# Patient Record
Sex: Female | Born: 1970 | Race: Black or African American | Hispanic: No | Marital: Single | Smoking: Never smoker
Health system: Southern US, Community
[De-identification: ages and names within clinical notes are randomized; demographics above are authoritative.]

## PROBLEM LIST (undated history)

## (undated) ENCOUNTER — Ambulatory Visit (HOSPITAL_COMMUNITY): Discharge status: Absent without leave | Payer: Self-pay

## (undated) DIAGNOSIS — E119 Type 2 diabetes mellitus without complications: Secondary | ICD-10-CM

## (undated) DIAGNOSIS — I10 Essential (primary) hypertension: Secondary | ICD-10-CM

## (undated) HISTORY — DX: Essential (primary) hypertension: I10

## (undated) HISTORY — PX: APPENDECTOMY: SHX54

## (undated) HISTORY — DX: Type 2 diabetes mellitus without complications: E11.9

---

## 2016-02-25 ENCOUNTER — Emergency Department (HOSPITAL_COMMUNITY)
Admission: EM | Admit: 2016-02-25 | Discharge: 2016-02-25 | Disposition: A | Discharge status: Home / Self Care | Payer: Self-pay | Attending: Emergency Medicine | Admitting: Emergency Medicine

## 2016-02-25 ENCOUNTER — Encounter (HOSPITAL_COMMUNITY): Payer: Self-pay | Admitting: Emergency Medicine

## 2016-02-25 DIAGNOSIS — R1033 Periumbilical pain: Secondary | ICD-10-CM | POA: Insufficient documentation

## 2016-02-25 LAB — GC/CHLAMYDIA PROBE AMP (~~LOC~~) NOT AT ARMC
Chlamydia: NEGATIVE
Neisseria Gonorrhea: NEGATIVE

## 2016-02-25 LAB — WET PREP, GENITAL
CLUE CELLS WET PREP: NONE SEEN
SPERM: NONE SEEN
Trich, Wet Prep: NONE SEEN
YEAST WET PREP: NONE SEEN

## 2016-02-25 LAB — URINALYSIS, ROUTINE W REFLEX MICROSCOPIC
Bilirubin Urine: NEGATIVE
GLUCOSE, UA: NEGATIVE mg/dL
HGB URINE DIPSTICK: NEGATIVE
KETONES UR: NEGATIVE mg/dL
Leukocytes, UA: NEGATIVE
Nitrite: NEGATIVE
PH: 5 (ref 5.0–8.0)
PROTEIN: NEGATIVE mg/dL
Specific Gravity, Urine: 1.018 (ref 1.005–1.030)

## 2016-02-25 LAB — PREGNANCY, URINE: Preg Test, Ur: NEGATIVE

## 2016-02-25 MED ORDER — HYDROCODONE-ACETAMINOPHEN 5-325 MG PO TABS
1.0000 | ORAL_TABLET | Freq: Once | ORAL | Status: AC
  Administered 2016-02-25: 1 via ORAL
  Filled 2016-02-25: qty 1

## 2016-02-25 MED ORDER — HYDROCODONE-ACETAMINOPHEN 5-325 MG PO TABS: 1.0000 | ORAL_TABLET | ORAL | 0 refills | Status: DC | PRN

## 2016-02-25 NOTE — ED Triage Notes (Signed)
Per pt, she has lower abdominal pain x 1 day. Pt describes pain as pressure. Denies n/v/diarrhea. Pt states she is having frequent urination, denies vaginal discharge, or painful urination.

## 2016-02-25 NOTE — ED Provider Notes (Signed)
[facility]PT Provider Note   CSN: [id number] Arrival date & time: 02/25/16  0408     History   Chief Complaint Chief Complaint  Patient presents with  . Abdominal Pain    HPI Patricia Johnson is a 46 y.o. female.  Patient presents with complaint of periumbilical abdominal pain for 2-3 days. No aggravating or alleviating factors. No dysuria, vaginal discharge, vaginal bleeding. She reports she had a miscarriage 3 months ago requiring DnC. She has not had pain since that time but reports a missed period 3 weeks ago. No change in bowel movements. No nausea or vomiting. No fever. The pain does not radiate.    The history is provided by the patient. No language interpreter was used.    History reviewed. No pertinent past medical history.  There are no active problems to display for this patient.   History reviewed. No pertinent surgical history.  OB History    No data available       Home Medications    Prior to Admission medications   Not on File    Family History No family history on file.  Social History Social History  Substance Use Topics  . Smoking status: Never Smoker  . Smokeless tobacco: Never Used  . Alcohol use No     Allergies   Patient has no known allergies.   Review of Systems Review of Systems  Constitutional: Negative for chills and fever.  HENT: Negative.   Respiratory: Negative.   Cardiovascular: Negative.   Gastrointestinal: Positive for abdominal pain. Negative for constipation, diarrhea, nausea and vomiting.  Genitourinary: Negative for dysuria, vaginal bleeding and vaginal discharge.  Musculoskeletal: Negative.   Skin: Negative.   Neurological: Negative.      Physical Exam Updated Vital Signs BP 137/92 (BP Location: Right Arm)   Pulse 64   Temp 97.9 F (36.6 C) (Oral)   Resp 18   LMP 01/28/2016   SpO2 100%   Physical Exam  Constitutional: She appears well-developed and well-nourished.  HENT:    Head: Normocephalic.  Neck: Normal range of motion. Neck supple.  Cardiovascular: Normal rate and regular rhythm.   Pulmonary/Chest: Effort normal and breath sounds normal.  Abdominal: Soft. Bowel sounds are normal. There is tenderness (periumbilical abdominal tenderness. ). There is no rebound and no guarding.  Musculoskeletal: Normal range of motion.  Neurological: She is alert. No cranial nerve deficit.  Skin: Skin is warm and dry. No rash noted.  Psychiatric: She has a normal mood and affect.     ED Treatments / Results  Labs (all labs ordered are listed, but only abnormal results are displayed) Labs Reviewed  URINALYSIS, ROUTINE W REFLEX MICROSCOPIC - Abnormal; Notable for the following:       Result Value   APPearance HAZY (*)    All other components within normal limits  WET PREP, GENITAL  PREGNANCY, URINE  GC/CHLAMYDIA PROBE AMP ([place]) NOT [facility]    EKG  EKG Interpretation None       Radiology No results found.  Procedures Procedures (including critical care time)  Medications Ordered in ED Medications  HYDROcodone-acetaminophen (NORCO/VICODIN) 5-325 MG per tablet 1 tablet (1 tablet Oral Given 02/25/16 0517)     Initial Impression / Assessment and Plan / ED Course  I have reviewed the triage vital signs and the nursing notes.  Pertinent labs & imaging results that were available during my care of the patient were reviewed by me and considered in my medical decision making (  see chart for details).  Clinical Course     Patient reports periumbilical abdominal discomfort for 3 days. No fever, vomiting, aggravating or alleviating factors. She has tenderness on exam without guarding, a normal vaginal exam and a negative urine.   She reports her pain is resolved on re-examination. VSS. Discussed return precautions and outpatient follow up for recheck of symptoms.   Final Clinical Impressions(s) / ED Diagnoses   Final diagnoses:  None   1.  Abdominal pain  New Prescriptions New Prescriptions   No medications on file     Elpidio Anis, Cordelia Poche 02/27/16 0544    Zadie Rhine, MD 02/28/16 1431

## 2016-06-21 ENCOUNTER — Encounter (HOSPITAL_COMMUNITY): Payer: Self-pay

## 2016-06-21 DIAGNOSIS — R739 Hyperglycemia, unspecified: Secondary | ICD-10-CM | POA: Insufficient documentation

## 2016-06-21 DIAGNOSIS — K5900 Constipation, unspecified: Secondary | ICD-10-CM | POA: Insufficient documentation

## 2016-06-21 DIAGNOSIS — K8020 Calculus of gallbladder without cholecystitis without obstruction: Secondary | ICD-10-CM | POA: Insufficient documentation

## 2016-06-21 LAB — COMPREHENSIVE METABOLIC PANEL
ALBUMIN: 4.2 g/dL (ref 3.5–5.0)
ALT: 18 U/L (ref 14–54)
ANION GAP: 8 (ref 5–15)
AST: 31 U/L (ref 15–41)
Alkaline Phosphatase: 56 U/L (ref 38–126)
BILIRUBIN TOTAL: 1 mg/dL (ref 0.3–1.2)
BUN: 12 mg/dL (ref 6–20)
CHLORIDE: 105 mmol/L (ref 101–111)
CO2: 24 mmol/L (ref 22–32)
Calcium: 9.2 mg/dL (ref 8.9–10.3)
Creatinine, Ser: 0.79 mg/dL (ref 0.44–1.00)
GFR calc Af Amer: >60 mL/min (ref >60)
GFR calc non Af Amer: >60 mL/min (ref >60)
GLUCOSE: 138 mg/dL — AB (ref 65–99)
POTASSIUM: 4.5 mmol/L (ref 3.5–5.1)
Sodium: 137 mmol/L (ref 135–145)
TOTAL PROTEIN: 7.5 g/dL (ref 6.5–8.1)

## 2016-06-21 LAB — URINALYSIS, ROUTINE W REFLEX MICROSCOPIC
Bilirubin Urine: NEGATIVE
GLUCOSE, UA: NEGATIVE mg/dL
HGB URINE DIPSTICK: NEGATIVE
Ketones, ur: NEGATIVE mg/dL
LEUKOCYTES UA: NEGATIVE
NITRITE: NEGATIVE
PH: 5 (ref 5.0–8.0)
Protein, ur: 100 mg/dL — AB
SPECIFIC GRAVITY, URINE: 1.028 (ref 1.005–1.030)

## 2016-06-21 LAB — CBC
HEMATOCRIT: 40.7 % (ref 36.0–46.0)
HEMOGLOBIN: 13.4 g/dL (ref 12.0–15.0)
MCH: 28.3 pg (ref 26.0–34.0)
MCHC: 32.9 g/dL (ref 30.0–36.0)
MCV: 86 fL (ref 78.0–100.0)
PLATELETS: 243 10*3/uL (ref 150–400)
RBC: 4.73 MIL/uL (ref 3.87–5.11)
RDW: 13.7 % (ref 11.5–15.5)
WBC: 7.6 10*3/uL (ref 4.0–10.5)

## 2016-06-21 LAB — POC URINE PREG, ED: Preg Test, Ur: NEGATIVE

## 2016-06-21 LAB — LIPASE, BLOOD: LIPASE: 19 U/L (ref 11–51)

## 2016-06-21 NOTE — ED Triage Notes (Signed)
Pt complaining of R sided abdominal pain. Pt denies any urinary symptoms. Pt denies any N/V/D at triage. Pt states LMP 2 months ago. Pt denies any fevers.

## 2016-06-22 ENCOUNTER — Emergency Department (HOSPITAL_COMMUNITY)
Admission: EM | Admit: 2016-06-22 | Discharge: 2016-06-22 | Disposition: A | Discharge status: Home / Self Care | Payer: Self-pay | Attending: Emergency Medicine | Admitting: Emergency Medicine

## 2016-06-22 ENCOUNTER — Emergency Department (HOSPITAL_COMMUNITY): Payer: Self-pay

## 2016-06-22 DIAGNOSIS — R739 Hyperglycemia, unspecified: Secondary | ICD-10-CM

## 2016-06-22 DIAGNOSIS — K8020 Calculus of gallbladder without cholecystitis without obstruction: Secondary | ICD-10-CM

## 2016-06-22 DIAGNOSIS — K5900 Constipation, unspecified: Secondary | ICD-10-CM

## 2016-06-22 DIAGNOSIS — R109 Unspecified abdominal pain: Secondary | ICD-10-CM

## 2016-06-22 MED ORDER — IOPAMIDOL (ISOVUE-300) INJECTION 61%
INTRAVENOUS | Status: AC
  Administered 2016-06-22: 100 mL
  Filled 2016-06-22: qty 100

## 2016-06-22 MED ORDER — DIPHENHYDRAMINE HCL 50 MG/ML IJ SOLN
25.0000 mg | Freq: Once | INTRAMUSCULAR | Status: AC
  Administered 2016-06-22: 25 mg via INTRAVENOUS
  Filled 2016-06-22: qty 1

## 2016-06-22 MED ORDER — MAGNESIUM CITRATE PO SOLN
1.0000 | Freq: Once | ORAL | Status: AC
  Administered 2016-06-22: 1 via ORAL
  Filled 2016-06-22: qty 296

## 2016-06-22 MED ORDER — KETOROLAC TROMETHAMINE 30 MG/ML IJ SOLN
30.0000 mg | Freq: Once | INTRAMUSCULAR | Status: AC
  Administered 2016-06-22: 30 mg via INTRAVENOUS
  Filled 2016-06-22: qty 1

## 2016-06-22 NOTE — Discharge Instructions (Signed)
Your blood sugar was slightly elevated today - 138. You need to have this rechecked several times over the next 2-3 weeks. If it stays elevated, you may need to start treatment for diabetes.

## 2016-06-22 NOTE — ED Provider Notes (Signed)
[facility] Provider Note   CSN: [id number] Arrival date & time: 06/21/16  2057  By signing my name below, I, Cynda Acres, attest that this documentation has been prepared under the direction and in the presence of Dione Booze, MD. Electronically Signed: Cynda Acres, Scribe. 06/22/16. 1:14 AM.  History   Chief Complaint Chief Complaint  Patient presents with  . Abdominal Pain    HPI Comments: Patricia Johnson is a 46 y.o. female with no pertinent past medical history, who presents to the Emergency Department complaining of sudden-onset, constant right-sided abdominal pain that began one week ago. Patient reports excruciating abdominal pain that began one week ago. Patient was seen here in January for similar symptoms, resolved with hydrocodone. No associated symptoms. Patient reports taking tylenol with minimal relief. LNMP was two months ago. Patient denies any nausea, vomiting, trouble urinating, dysuria, fever, or chills.   The history is provided by the patient. No language interpreter was used.    History reviewed. No pertinent past medical history.  There are no active problems to display for this patient.   History reviewed. No pertinent surgical history.  OB History    No data available       Home Medications    Prior to Admission medications   Medication Sig Start Date End Date Taking? Authorizing Provider  HYDROcodone-acetaminophen (NORCO/VICODIN) 5-325 MG tablet Take 1 tablet by mouth every 4 (four) hours as needed. 02/25/16   Elpidio Anis, PA-C    Family History History reviewed. No pertinent family history.  Social History Social History  Substance Use Topics  . Smoking status: Never Smoker  . Smokeless tobacco: Never Used  . Alcohol use No     Allergies   Patient has no known allergies.   Review of Systems Review of Systems  Constitutional: Negative for chills and fever.  Gastrointestinal: Positive for abdominal pain.  Negative for nausea and vomiting.  All other systems reviewed and are negative.    Physical Exam Updated Vital Signs BP 124/90   Pulse 82   Temp 98.2 F (36.8 C) (Oral)   Resp 16   Ht 5\' 5"  (1.651 m)   Wt 233 lb 4.8 oz (105.8 kg)   LMP 04/09/2016 (Approximate)   SpO2 100%   BMI 38.82 kg/m   Physical Exam  Constitutional: She is oriented to person, place, and time. She appears well-developed and well-nourished.  HENT:  Head: Normocephalic and atraumatic.  Eyes: EOM are normal. Pupils are equal, round, and reactive to light.  Neck: Normal range of motion. Neck supple. No JVD present.  Cardiovascular: Normal rate, regular rhythm and normal heart sounds.   No murmur heard. Pulmonary/Chest: Effort normal and breath sounds normal. She has no wheezes. She has no rales. She exhibits no tenderness.  Abdominal: Soft. Bowel sounds are normal. She exhibits no distension and no mass. There is tenderness. There is no rebound and no guarding.  Moderate right lower quadrant tenderness.   Musculoskeletal: Normal range of motion. She exhibits no edema.  Lymphadenopathy:    She has no cervical adenopathy.  Neurological: She is alert and oriented to person, place, and time. No cranial nerve deficit. She exhibits normal muscle tone. Coordination normal.  Skin: Skin is warm and dry. No rash noted.  Psychiatric: She has a normal mood and affect. Her behavior is normal. Judgment and thought content normal.  Nursing note and vitals reviewed.    ED Treatments / Results  DIAGNOSTIC STUDIES: Oxygen Saturation is 100% on RA, normal  by my interpretation.    COORDINATION OF CARE: 1:13 AM Discussed treatment plan with pt at bedside and pt agreed to plan, which includes pain medication and an abdominal CT scan.   Labs (all labs ordered are listed, but only abnormal results are displayed) Labs Reviewed  COMPREHENSIVE METABOLIC PANEL - Abnormal; Notable for the following:       Result Value    Glucose, Bld 138 (*)    All other components within normal limits  URINALYSIS, ROUTINE W REFLEX MICROSCOPIC - Abnormal; Notable for the following:    APPearance CLOUDY (*)    Protein, ur 100 (*)    Bacteria, UA RARE (*)    Squamous Epithelial / LPF 6-30 (*)    All other components within normal limits  LIPASE, BLOOD  CBC  POC URINE PREG, ED    Radiology Ct Abdomen Pelvis W Contrast  Result Date: 06/22/2016 CLINICAL DATA:  Right-sided abdominal pain EXAM: CT ABDOMEN AND PELVIS WITH CONTRAST TECHNIQUE: Multidetector CT imaging of the abdomen and pelvis was performed using the standard protocol following bolus administration of intravenous contrast. CONTRAST:  100mL ISOVUE-300 IOPAMIDOL (ISOVUE-300) INJECTION 61% COMPARISON:  None. FINDINGS: Lower chest: No acute abnormality. Hepatobiliary: Mild hepatic steatosis with uncomplicated cholelithiasis. No biliary dilatation or focal hepatic mass. Pancreas: Normal Spleen: Normal Adrenals/Urinary Tract: Adrenal glands are unremarkable. Kidneys are normal, without renal calculi, focal lesion, or hydronephrosis. Bladder is unremarkable. Stomach/Bowel: Normal appendix. Moderate colonic stool burden without bowel obstruction or inflammation. Vascular/Lymphatic: No significant vascular findings are present. No enlarged abdominal or pelvic lymph nodes. Reproductive: Fibroid uterus with left-sided 3.3 cm intramural fundal fibroid with central hypodensity that may represent a degenerating fibroid. Small amount of fluid in the endocervical canal. No adnexal mass. Probable dominant follicle the left ovary. Other: Small fat containing umbilical hernia. No abdominopelvic ascites. Musculoskeletal: No acute or significant osseous findings. Subcutaneous gluteal soft tissue induration likely related to gluteal injections. IMPRESSION: 1. Hepatic steatosis. 2. Uncomplicated cholelithiasis. 3. Degenerating left-sided intramural fundal fibroid measuring 3.3 cm. 4. Increased  colonic stool burden.  Normal appendix. Electronically Signed   By: Tollie Ethavid  Kwon M.D.   On: 06/22/2016 03:50    Procedures Procedures (including critical care time)  Medications Ordered in ED Medications  magnesium citrate solution 1 Bottle (not administered)  ketorolac (TORADOL) 30 MG/ML injection 30 mg (30 mg Intravenous Given 06/22/16 0254)  diphenhydrAMINE (BENADRYL) injection 25 mg (25 mg Intravenous Given 06/22/16 0305)  iopamidol (ISOVUE-300) 61 % injection (100 mLs  Contrast Given 06/22/16 0327)     Initial Impression / Assessment and Plan / ED Course  I have reviewed the triage vital signs and the nursing notes.  Pertinent labs & imaging results that were available during my care of the patient were reviewed by me and considered in my medical decision making (see chart for details).  Right lower quadrant abdominal pain. Tenderness is maximum over McBurney's area. Initial laboratory workup is unremarkable. We'll send for CT of abdomen and pelvis. Old records are reviewed, and she has a prior ED visit in January of this year for abdominal pain.  CT scan shows increased stool burden and cholelithiasis but no acute process. After workup is significant only for borderline hyperglycemia of. This will need to be followed as an outpatient. She is given a dose of magnesium citrate. Doubt cholelithiasis is the cause of her pain. She is given referral to gastroenterology for further outpatient evaluation is indicated.  Final Clinical Impressions(s) / ED Diagnoses   Final  diagnoses:  Abdominal pain, unspecified abdominal location  Constipation, unspecified constipation type  Hyperglycemia  Calculus of gallbladder without cholecystitis without obstruction    New Prescriptions New Prescriptions   No medications on file   I personally performed the services described in this documentation, which was scribed in my presence. The recorded information has been reviewed and is accurate.        Dione Booze, MD 06/22/16 [phone]

## 2019-10-18 ENCOUNTER — Encounter: Payer: Self-pay | Admitting: Family Medicine

## 2019-10-18 ENCOUNTER — Ambulatory Visit (INDEPENDENT_AMBULATORY_CARE_PROVIDER_SITE_OTHER): Payer: 59 | Admitting: Family Medicine

## 2019-10-18 ENCOUNTER — Other Ambulatory Visit (HOSPITAL_COMMUNITY)
Admission: RE | Admit: 2019-10-18 | Discharge: 2019-10-18 | Disposition: A | Discharge status: Home / Self Care | Payer: Self-pay | Source: Ambulatory Visit | Attending: Family Medicine | Admitting: Family Medicine

## 2019-10-18 ENCOUNTER — Other Ambulatory Visit: Payer: Self-pay

## 2019-10-18 VITALS — BP 130/80 | HR 90 | Ht 66.0 in | Wt 225.8 lb

## 2019-10-18 DIAGNOSIS — K760 Fatty (change of) liver, not elsewhere classified: Secondary | ICD-10-CM

## 2019-10-18 DIAGNOSIS — Z7185 Encounter for immunization safety counseling: Secondary | ICD-10-CM

## 2019-10-18 DIAGNOSIS — Z1211 Encounter for screening for malignant neoplasm of colon: Secondary | ICD-10-CM

## 2019-10-18 DIAGNOSIS — A159 Respiratory tuberculosis unspecified: Secondary | ICD-10-CM | POA: Insufficient documentation

## 2019-10-18 DIAGNOSIS — Z0000 Encounter for general adult medical examination without abnormal findings: Secondary | ICD-10-CM | POA: Diagnosis not present

## 2019-10-18 DIAGNOSIS — Z23 Encounter for immunization: Secondary | ICD-10-CM | POA: Diagnosis not present

## 2019-10-18 DIAGNOSIS — Z1231 Encounter for screening mammogram for malignant neoplasm of breast: Secondary | ICD-10-CM | POA: Diagnosis not present

## 2019-10-18 DIAGNOSIS — Z1329 Encounter for screening for other suspected endocrine disorder: Secondary | ICD-10-CM

## 2019-10-18 DIAGNOSIS — E669 Obesity, unspecified: Secondary | ICD-10-CM

## 2019-10-18 DIAGNOSIS — Z7189 Other specified counseling: Secondary | ICD-10-CM

## 2019-10-18 DIAGNOSIS — Z1159 Encounter for screening for other viral diseases: Secondary | ICD-10-CM

## 2019-10-18 DIAGNOSIS — Z124 Encounter for screening for malignant neoplasm of cervix: Secondary | ICD-10-CM

## 2019-10-18 NOTE — Patient Instructions (Signed)

## 2019-10-18 NOTE — Progress Notes (Signed)
Subjective:    Patient ID: Patricia Johnson, female    DOB: 1970-08-25, 49 y.o.   MRN: [id number]  HPI Chief Complaint  Patient presents with  . new pt    fasting cpe, never had pap smear   She is new to the practice and here for a complete physical exam. Moved here from [place] in 2017.  Last CPE: 2017   Other providers: None   Reviewed chart and CT abdomen in 2018 showed fatty liver  Social history: Lives with her husband, 3 children in [place].  works in housekeeping for [employer] Denies smoking, drinking alcohol, drug use  Diet: healthy per patient Excerise: active job   Immunizations: Covid vaccine. Other immunizations unknown.   States she took medication for 6 months for TB   Health maintenance:  Mammogram: never  Colonoscopy: never  Last Gynecological Exam: never  Last Menstrual cycle: 03/2019  Last Dental Exam: ago  Last Eye Exam: in the past few months   Wears seatbelt always, smoke detectors in home and functioning, does not text while driving and feels safe in home environment.   Reviewed allergies, medications, past medical, surgical, family, and social history.   Review of Systems Review of Systems Constitutional: -fever, -chills, -sweats, -unexpected weight change,-fatigue ENT: -runny nose, -ear pain, -sore throat Cardiology:  -chest pain, -palpitations, -edema Respiratory: -cough, -shortness of breath, -wheezing Gastroenterology: -abdominal pain, -nausea, -vomiting, -diarrhea, -constipation  Hematology: -bleeding or bruising problems Musculoskeletal: -arthralgias, -myalgias, -joint swelling, -back pain Ophthalmology: -vision changes Urology: -dysuria, -difficulty urinating, -hematuria, -urinary frequency, -urgency Neurology: -headache, -weakness, -tingling, -numbness       Objective:   Physical Exam BP 130/80   Pulse 90   Ht 5\' 6"  (1.676 m)   Wt 225 lb 12.8 oz (102.4 kg)   BMI 36.45 kg/m   General Appearance:    Alert,  cooperative, no distress, appears stated age  Head:    Normocephalic, without obvious abnormality, atraumatic  Eyes:    PERRL, conjunctiva/corneas clear, EOM's intact  Ears:    Normal TM's and external ear canals  Nose:   Mask on   Throat:   Mask on   Neck:   Supple, no lymphadenopathy;  thyroid:  no   enlargement/tenderness/nodules; no JVD  Back:    Spine nontender, no curvature, ROM normal, no CVA     tenderness  Lungs:     Clear to auscultation bilaterally without wheezes, rales or     ronchi; respirations unlabored  Chest Wall:    No tenderness or deformity   Heart:    Regular rate and rhythm, S1 and S2 normal, no murmur, rub   or gallop  Breast Exam:    No tenderness, masses, or nipple discharge or inversion.      No axillary lymphadenopathy  Abdomen:     Soft, non-tender, nondistended, normoactive bowel sounds,    no masses, no hepatosplenomegaly  Genitalia:    Normal external genitalia without lesions.  BUS and vagina normal; cervix partially visualized.  No abnormal vaginal discharge.  Difficult to fully assess due to patient discomfort.   Pap performed. Chaperone present.      Extremities:   No clubbing, cyanosis or edema  Pulses:   2+ and symmetric all extremities  Skin:   Skin color, texture, turgor normal, no rashes or lesions  Lymph nodes:   Cervical, supraclavicular, and axillary nodes normal  Neurologic:   CNII-XII intact, normal strength, sensation and gait; reflexes 2+ and symmetric throughout  Psych:   Normal mood, affect, hygiene and grooming.          Assessment & Plan:  Routine general medical examination at a health care facility - Plan: CBC with Differential/Platelet, Comprehensive metabolic panel, TSH, T4, free, Lipid panel -She is new to the practice and here for a CPE.  She is fasting.  Offered to use a medical interpreter and she declines.  She is deficient in most areas of preventive health care.  She would need to call and schedule her mammogram.  I  will refer to GI for her for screening colonoscopy.  She has never had a Pap smear and I will update this today.  Counseling on healthy lifestyle including diet and exercise.  Immunizations reviewed  Obesity (BMI 30-39.9) - Plan: Comprehensive metabolic panel, TSH, T4, free, Lipid panel -Counseling on healthy lifestyle including diet and exercise.  Recommend weight loss  Screening for cervical cancer - Plan: Cytology - PAP([place]) -First Pap smear was done today and she tolerated this but there were some issues with her anxiety.  Chaperone present.  Encounter for screening mammogram for malignant neoplasm of breast - Plan: MM DIGITAL SCREENING BILATERAL -She will need to call and schedule this at the breast center and she is aware  Immunization counseling -Reports having her Covid vaccine.  Flu shot given today.  She thinks she is up-to-date on other vaccines as well.  No record of these.  Screening for thyroid disorder - Plan: TSH, T4, free -Follow-up pending results  Hepatic steatosis - Plan: Comprehensive metabolic panel -Counseling on fatty liver disease and protecting her liver.  Specifically discussed weight loss.  TB (tuberculosis), treated -She has a letter from the health department stating she was treated for positive TB test  Need for hepatitis C screening test - Plan: Hepatitis C antibody -Done per screening guidelines  Screen for colon cancer - Plan: Ambulatory referral to Gastroenterology -Refer to GI for her first screening colonoscopy  Needs flu shot -Counseling on this vaccine

## 2019-10-19 LAB — COMPREHENSIVE METABOLIC PANEL
ALT: 12 IU/L (ref 0–32)
AST: 18 IU/L (ref 0–40)
Albumin/Globulin Ratio: 1.4 (ref 1.2–2.2)
Albumin: 4.6 g/dL (ref 3.8–4.9)
Alkaline Phosphatase: 111 IU/L (ref 48–121)
BUN/Creatinine Ratio: 24 — ABNORMAL HIGH (ref 9–23)
BUN: 14 mg/dL (ref 6–24)
Bilirubin Total: 0.8 mg/dL (ref 0.0–1.2)
CO2: 25 mmol/L (ref 20–29)
Calcium: 10.1 mg/dL (ref 8.7–10.2)
Chloride: 98 mmol/L (ref 96–106)
Creatinine, Ser: 0.58 mg/dL (ref 0.57–1.00)
GFR calc Af Amer: 123 mL/min/{1.73_m2} (ref >59)
GFR calc non Af Amer: 107 mL/min/{1.73_m2} (ref >59)
Globulin, Total: 3.2 g/dL (ref 1.5–4.5)
Glucose: 143 mg/dL — ABNORMAL HIGH (ref 65–99)
Potassium: 4.2 mmol/L (ref 3.5–5.2)
Sodium: 137 mmol/L (ref 134–144)
Total Protein: 7.8 g/dL (ref 6.0–8.5)

## 2019-10-19 LAB — CBC WITH DIFFERENTIAL/PLATELET
Basophils Absolute: 0 10*3/uL (ref 0.0–0.2)
Basos: 0 %
EOS (ABSOLUTE): 0.1 10*3/uL (ref 0.0–0.4)
Eos: 1 %
Hematocrit: 40.7 % (ref 34.0–46.6)
Hemoglobin: 13.9 g/dL (ref 11.1–15.9)
Immature Grans (Abs): 0 10*3/uL (ref 0.0–0.1)
Immature Granulocytes: 0 %
Lymphocytes Absolute: 3.6 10*3/uL — ABNORMAL HIGH (ref 0.7–3.1)
Lymphs: 57 %
MCH: 28.6 pg (ref 26.6–33.0)
MCHC: 34.2 g/dL (ref 31.5–35.7)
MCV: 84 fL (ref 79–97)
Monocytes Absolute: 0.5 10*3/uL (ref 0.1–0.9)
Monocytes: 7 %
Neutrophils Absolute: 2.2 10*3/uL (ref 1.4–7.0)
Neutrophils: 35 %
Platelets: 246 10*3/uL (ref 150–450)
RBC: 4.86 x10E6/uL (ref 3.77–5.28)
RDW: 12.6 % (ref 11.7–15.4)
WBC: 6.3 10*3/uL (ref 3.4–10.8)

## 2019-10-19 LAB — T4, FREE: Free T4: 1.04 ng/dL (ref 0.82–1.77)

## 2019-10-19 LAB — LIPID PANEL
Chol/HDL Ratio: 4.7 ratio — ABNORMAL HIGH (ref 0.0–4.4)
Cholesterol, Total: 244 mg/dL — ABNORMAL HIGH (ref 100–199)
HDL: 52 mg/dL (ref >39)
LDL Chol Calc (NIH): 162 mg/dL — ABNORMAL HIGH (ref 0–99)
Triglycerides: 167 mg/dL — ABNORMAL HIGH (ref 0–149)
VLDL Cholesterol Cal: 30 mg/dL (ref 5–40)

## 2019-10-19 LAB — TSH: TSH: 2.3 u[IU]/mL (ref 0.450–4.500)

## 2019-10-19 LAB — HEPATITIS C ANTIBODY: Hep C Virus Ab: 0.2 s/co ratio (ref 0.0–0.9)

## 2019-10-19 NOTE — Addendum Note (Signed)
Addended by: Herminio Commons A on: 10/19/2019 07:37 AM   Modules accepted: Orders

## 2019-10-20 ENCOUNTER — Other Ambulatory Visit: Payer: Self-pay | Admitting: Family Medicine

## 2019-10-20 ENCOUNTER — Encounter: Payer: Self-pay | Admitting: Family Medicine

## 2019-10-20 DIAGNOSIS — E785 Hyperlipidemia, unspecified: Secondary | ICD-10-CM | POA: Insufficient documentation

## 2019-10-20 DIAGNOSIS — E119 Type 2 diabetes mellitus without complications: Secondary | ICD-10-CM

## 2019-10-20 DIAGNOSIS — E1169 Type 2 diabetes mellitus with other specified complication: Secondary | ICD-10-CM

## 2019-10-20 HISTORY — DX: Hyperlipidemia, unspecified: E78.5

## 2019-10-20 HISTORY — DX: Type 2 diabetes mellitus with other specified complication: E11.69

## 2019-10-20 HISTORY — DX: Type 2 diabetes mellitus without complications: E11.9

## 2019-10-20 MED ORDER — METFORMIN HCL 500 MG PO TABS: 500.0000 mg | ORAL_TABLET | Freq: Two times a day (BID) | ORAL | 1 refills | Status: DC

## 2019-10-20 MED ORDER — ATORVASTATIN CALCIUM 20 MG PO TABS: 20.0000 mg | ORAL_TABLET | Freq: Every day | ORAL | 1 refills | Status: DC

## 2019-10-20 NOTE — Progress Notes (Signed)
A pharmacy was not selected for her.  Please find out which pharmacy she uses so that I can send in the Metformin.

## 2019-10-20 NOTE — Progress Notes (Signed)
She has diabetes which I believe is new.  Please schedule her to come back Wednesday next week so we can discuss starting her on medication and teach her how to check her blood sugars.  I will go ahead and send in a medication for her to start taking twice daily with breakfast and supper.  Please encourage her to cut back on sugar and carbohydrates such as rice, bread, potatoes, pasta.

## 2019-10-24 LAB — CYTOLOGY - PAP
Adequacy: ABSENT
Comment: NEGATIVE
Diagnosis: NEGATIVE
High risk HPV: NEGATIVE

## 2019-10-24 NOTE — Progress Notes (Signed)
Her pap smear is negative for abnormal cells or HPV. This is good news.

## 2019-10-25 ENCOUNTER — Institutional Professional Consult (permissible substitution): Payer: 59 | Admitting: Family Medicine

## 2019-10-26 ENCOUNTER — Encounter: Payer: Self-pay | Admitting: Family Medicine

## 2019-10-26 LAB — SPECIMEN STATUS REPORT

## 2019-10-26 LAB — HGB A1C W/O EAG: Hgb A1c MFr Bld: 8.8 % — ABNORMAL HIGH (ref 4.8–5.6)

## 2019-10-29 NOTE — Progress Notes (Signed)
She no showed her last visit. She still needs a visit to discuss new diabetes. Thanks.

## 2019-11-02 ENCOUNTER — Encounter: Payer: Self-pay | Admitting: Internal Medicine

## 2020-07-11 ENCOUNTER — Encounter: Payer: Self-pay | Admitting: Family Medicine

## 2020-07-11 ENCOUNTER — Telehealth: Payer: Self-pay

## 2020-07-11 ENCOUNTER — Ambulatory Visit: Payer: 59 | Admitting: Family Medicine

## 2020-07-11 ENCOUNTER — Other Ambulatory Visit: Payer: Self-pay

## 2020-07-11 VITALS — BP 130/82 | HR 80 | Temp 98.1°F | Wt 222.2 lb

## 2020-07-11 DIAGNOSIS — N644 Mastodynia: Secondary | ICD-10-CM

## 2020-07-11 DIAGNOSIS — E1165 Type 2 diabetes mellitus with hyperglycemia: Secondary | ICD-10-CM | POA: Diagnosis not present

## 2020-07-11 DIAGNOSIS — Z23 Encounter for immunization: Secondary | ICD-10-CM

## 2020-07-11 DIAGNOSIS — A159 Respiratory tuberculosis unspecified: Secondary | ICD-10-CM

## 2020-07-11 DIAGNOSIS — E785 Hyperlipidemia, unspecified: Secondary | ICD-10-CM

## 2020-07-11 DIAGNOSIS — E669 Obesity, unspecified: Secondary | ICD-10-CM | POA: Diagnosis not present

## 2020-07-11 DIAGNOSIS — E1169 Type 2 diabetes mellitus with other specified complication: Secondary | ICD-10-CM

## 2020-07-11 DIAGNOSIS — K760 Fatty (change of) liver, not elsewhere classified: Secondary | ICD-10-CM

## 2020-07-11 LAB — COMPREHENSIVE METABOLIC PANEL
Alkaline Phosphatase: 119 IU/L (ref 44–121)
Glucose: 186 mg/dL — ABNORMAL HIGH (ref 65–99)
eGFR: 105 mL/min/{1.73_m2} (ref >59)

## 2020-07-11 LAB — POCT GLYCOSYLATED HEMOGLOBIN (HGB A1C): Hemoglobin A1C: 9.5 % — AB (ref 4.0–5.6)

## 2020-07-11 LAB — LIPID PANEL: Triglycerides: 117 mg/dL (ref 0–149)

## 2020-07-11 LAB — CBC WITH DIFFERENTIAL/PLATELET
MCV: 84 fL (ref 79–97)
Neutrophils Absolute: 2 10*3/uL (ref 1.4–7.0)
RBC: 4.76 x10E6/uL (ref 3.77–5.28)

## 2020-07-11 MED ORDER — ATORVASTATIN CALCIUM 20 MG PO TABS
20.0000 mg | ORAL_TABLET | Freq: Every day | ORAL | 1 refills | Status: DC
Start: 2020-07-11 — End: 2021-03-10

## 2020-07-11 MED ORDER — ONETOUCH ULTRASOFT LANCETS MISC: 1 refills | Status: AC

## 2020-07-11 MED ORDER — GLUCOSE BLOOD VI STRP: ORAL_STRIP | 1 refills | Status: AC

## 2020-07-11 MED ORDER — METFORMIN HCL 1000 MG PO TABS: 1000.0000 mg | ORAL_TABLET | Freq: Two times a day (BID) | ORAL | 1 refills | Status: DC

## 2020-07-11 MED ORDER — BLOOD GLUCOSE MONITORING SUPPL DEVI: 0 refills | Status: AC

## 2020-07-11 MED ORDER — GLIPIZIDE 5 MG PO TABS: 5.0000 mg | ORAL_TABLET | Freq: Two times a day (BID) | ORAL | 1 refills | Status: DC

## 2020-07-11 NOTE — Progress Notes (Signed)
Subjective:    Patient ID: Patricia Johnson, female    DOB: [birth year], 50 y.o.   MRN: [id number]  HPI Chief Complaint  Patient presents with  . other    Swelling and chest pain that comes and goes but not hurting now lt. Side muscle pain in abdomen swelling in both knees   She is here with positive TB blood test done at [facility] lab on 07/01/2020.  States she was treated for 6 months in 2018.   Also here for DM follow up.  States she took metformin for 4 months and then ran out. Not currently on any medication.   She checks her blood sugar at home and last time it was 122. She checks her BS sporadically.   Denies chest pain but states she has right breast pain. No mass.  States she has never had a mammogram.   States she had the J&J vaccine and would like a COVID booster  Denies fever, chills, dizziness, chest pain, palpitations, shortness of breath, abdominal pain, N/V/D, urinary symptoms, LE edema.   Reviewed allergies, medications, past medical, surgical, family, and social history.     Review of Systems Pertinent positives and negatives in the history of present illness.     Objective:   Physical Exam Constitutional:      General: She is not in acute distress.    Appearance: Normal appearance. She is not ill-appearing.  Eyes:     Conjunctiva/sclera: Conjunctivae normal.  Cardiovascular:     Rate and Rhythm: Normal rate and regular rhythm.     Pulses: Normal pulses.  Pulmonary:     Effort: Pulmonary effort is normal.     Breath sounds: Normal breath sounds.  Chest:  Breasts:     Right: Normal. No mass or tenderness.     Left: Normal. No mass or tenderness.    Musculoskeletal:     Cervical back: Normal range of motion and neck supple.  Skin:    General: Skin is warm and dry.  Neurological:     General: No focal deficit present.     Mental Status: She is alert.    BP 130/82   Pulse 80   Temp 98.1 F (36.7 C)   Wt 222 lb 3.2 oz (100.8 kg)    BMI 35.86 kg/m         Assessment & Plan:  Uncontrolled type 2 diabetes mellitus with hyperglycemia (HCC) - Plan: CBC with Differential/Platelet, Comprehensive metabolic panel, TSH, T4, free, POCT glycosylated hemoglobin (Hb A1C), metFORMIN (GLUCOPHAGE) 1000 MG tablet, glipiZIDE (GLUCOTROL) 5 MG tablet -Apparently her diabetes diagnosis is new as well September 2021.  Her hemoglobin A1c today is worse at 9.5% and her diabetes is uncontrolled.  It is unclear how long she has been off of her metformin. I will increase metformin to 1000 mg twice daily and also prescribe glipizide. Counseling on how and when to check blood sugars at home. Counseling on low sugar, low carbohydrate diet and increasing physical activity. She will follow-up in 4 weeks with her blood sugar readings from home or sooner if needed.  Obesity (BMI 30-39.9) - Plan: CBC with Differential/Platelet, Comprehensive metabolic panel, TSH, T4, free -Advised weight loss can help with diabetes control.  Hyperlipidemia associated with type 2 diabetes mellitus (HCC) - Plan: Lipid panel, atorvastatin (LIPITOR) 20 MG tablet -Recommend she start back on statin therapy.  Check lipids and follow-up  Breast pain - Plan: MM DIAG BREAST TOMO BILATERAL, US BREAST LTD UNI RIGHT INC  AXILLA -Unclear as to whether she is having breast pain, discomfort or tenderness as her symptoms change throughout the visit.  Breast exam unremarkable.  I will order a diagnostic mammogram and breast ultrasound.  TB (tuberculosis), treated -Discussed that since she was treated at the [facility] department that she needs to follow-up with them to get the appropriate letter stating she has been treated and that her QuantiFERON blood test will remain positive in the future.  Hepatic steatosis -Recommend weight loss   COVID-19 vaccine administered - Plan: PFIZER Comirnaty(GRAY TOP)COVID-19 Vaccine -Counseling on potential side effects.  She  tolerated the vaccine well

## 2020-07-11 NOTE — Telephone Encounter (Signed)
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 Patient was provider the phone number to contact [facility].

## 2020-07-11 NOTE — Patient Instructions (Signed)
Your diabetes is now much higher with a hemoglobin A1c 9.5%. The goal is less than 7%.   Start taking metformin 1,000 mg twice daily with breakfast and supper.   Start taking glipizide 5 mg with breakfast and supper (Do not take this if you do not eat).   Check your blood sugar every day when you wake up.   Return to see me in 4 weeks to see how you are doing with your diabetes.   We will be in touch with your lab results.

## 2020-07-12 LAB — CBC WITH DIFFERENTIAL/PLATELET
Basophils Absolute: 0 10*3/uL (ref 0.0–0.2)
Basos: 0 %
EOS (ABSOLUTE): 0.1 10*3/uL (ref 0.0–0.4)
Eos: 2 %
Hematocrit: 40 % (ref 34.0–46.6)
Hemoglobin: 13.2 g/dL (ref 11.1–15.9)
Immature Grans (Abs): 0 10*3/uL (ref 0.0–0.1)
Immature Granulocytes: 0 %
Lymphocytes Absolute: 3.1 10*3/uL (ref 0.7–3.1)
Lymphs: 56 %
MCH: 27.7 pg (ref 26.6–33.0)
MCHC: 33 g/dL (ref 31.5–35.7)
Monocytes Absolute: 0.4 10*3/uL (ref 0.1–0.9)
Monocytes: 6 %
Neutrophils: 36 %
Platelets: 233 10*3/uL (ref 150–450)
RDW: 12.8 % (ref 11.7–15.4)
WBC: 5.7 10*3/uL (ref 3.4–10.8)

## 2020-07-12 LAB — COMPREHENSIVE METABOLIC PANEL
ALT: 13 IU/L (ref 0–32)
AST: 19 IU/L (ref 0–40)
Albumin/Globulin Ratio: 1.6 (ref 1.2–2.2)
Albumin: 4.5 g/dL (ref 3.8–4.9)
BUN/Creatinine Ratio: 12 (ref 9–23)
BUN: 8 mg/dL (ref 6–24)
Bilirubin Total: 1 mg/dL (ref 0.0–1.2)
CO2: 23 mmol/L (ref 20–29)
Calcium: 9.5 mg/dL (ref 8.7–10.2)
Chloride: 98 mmol/L (ref 96–106)
Creatinine, Ser: 0.68 mg/dL (ref 0.57–1.00)
Globulin, Total: 2.9 g/dL (ref 1.5–4.5)
Potassium: 4.2 mmol/L (ref 3.5–5.2)
Sodium: 138 mmol/L (ref 134–144)
Total Protein: 7.4 g/dL (ref 6.0–8.5)

## 2020-07-12 LAB — LIPID PANEL
Chol/HDL Ratio: 3.6 ratio (ref 0.0–4.4)
Cholesterol, Total: 184 mg/dL (ref 100–199)
HDL: 51 mg/dL (ref >39)
LDL Chol Calc (NIH): 112 mg/dL — ABNORMAL HIGH (ref 0–99)
VLDL Cholesterol Cal: 21 mg/dL (ref 5–40)

## 2020-07-12 LAB — T4, FREE: Free T4: 1.11 ng/dL (ref 0.82–1.77)

## 2020-07-12 LAB — TSH: TSH: 1.73 u[IU]/mL (ref 0.450–4.500)

## 2020-07-12 NOTE — Progress Notes (Signed)
Her blood sugar and cholesterol were both elevated which is not surprising. Starting back on the medication should help with her diabetes and high cholesterol.

## 2020-08-02 ENCOUNTER — Other Ambulatory Visit: Payer: Self-pay | Admitting: Family Medicine

## 2020-08-02 DIAGNOSIS — E1165 Type 2 diabetes mellitus with hyperglycemia: Secondary | ICD-10-CM

## 2020-08-08 ENCOUNTER — Ambulatory Visit: Payer: 59 | Admitting: Family Medicine

## 2020-08-13 ENCOUNTER — Ambulatory Visit: Payer: 59 | Admitting: Medical

## 2020-08-13 ENCOUNTER — Other Ambulatory Visit: Payer: Self-pay | Admitting: Medical

## 2020-08-13 ENCOUNTER — Telehealth: Payer: Self-pay | Admitting: Medical

## 2020-08-13 VITALS — BP 130/80 | HR 90 | Wt 221.0 lb

## 2020-08-13 DIAGNOSIS — R7612 Nonspecific reaction to cell mediated immunity measurement of gamma interferon antigen response without active tuberculosis: Secondary | ICD-10-CM | POA: Diagnosis not present

## 2020-08-13 DIAGNOSIS — E785 Hyperlipidemia, unspecified: Secondary | ICD-10-CM

## 2020-08-13 DIAGNOSIS — M25473 Effusion, unspecified ankle: Secondary | ICD-10-CM | POA: Diagnosis not present

## 2020-08-13 DIAGNOSIS — E1165 Type 2 diabetes mellitus with hyperglycemia: Secondary | ICD-10-CM

## 2020-08-13 DIAGNOSIS — M79674 Pain in right toe(s): Secondary | ICD-10-CM | POA: Diagnosis not present

## 2020-08-13 DIAGNOSIS — Z139 Encounter for screening, unspecified: Secondary | ICD-10-CM

## 2020-08-13 DIAGNOSIS — Z603 Acculturation difficulty: Secondary | ICD-10-CM

## 2020-08-13 DIAGNOSIS — M79672 Pain in left foot: Secondary | ICD-10-CM | POA: Diagnosis not present

## 2020-08-13 DIAGNOSIS — Z789 Other specified health status: Secondary | ICD-10-CM | POA: Insufficient documentation

## 2020-08-13 DIAGNOSIS — E1169 Type 2 diabetes mellitus with other specified complication: Secondary | ICD-10-CM

## 2020-08-13 DIAGNOSIS — M79676 Pain in unspecified toe(s): Secondary | ICD-10-CM

## 2020-08-13 DIAGNOSIS — Z8615 Personal history of latent tuberculosis infection: Secondary | ICD-10-CM

## 2020-08-13 NOTE — Telephone Encounter (Signed)
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 She completed 4 months of treatment in 2019. Quantifieron gold will always stay positive

## 2020-08-13 NOTE — Patient Instructions (Signed)
Diabetes Based on your home readings that you described today your sugars are looking much better Continue current medications Plan to follow-up in 2 months and we will recheck an hemoglobin A1c at that time  High cholesterol Continue your cholesterol medication and low-cholesterol diet Recommendations for improving lipids:  Foods TO AVOID or limit - fried foods, high sugar foods, white bread, enriched flour, fast food, red meat, large amounts of cheese, processed foods such as little debbie cakes, cookies, pies, donuts, for example  Foods TO INCLUDE in the diet - whole grains such as whole grain pasta, whole grain bread, barley, steel cut oatmeal (not instant oatmeal), avocado, fish, green leafy vegetables, nuts, increased fiber in diet, and using olive oil in small amounts for cooking or as salad dressing vinaigrette.     Right toe pain, rest pain, toenail discomfort I am referring you to a podiatrist/foot doctor for further evaluation They can also do diabetic nail care Consider wearing compression stockings since you are on your feet working all day and have some mild ankle swelling Avoid anti-inflammatories in order to protect your kidneys.  I recommend you avoid medications that can harm the kidneys such as ibuprofen, Aleve, Advil, Motrin, Naprosyn, or prescription anti-inflammatories which are used for pain, inflammation, and arthritis.   You should avoid dehydration which can harm the kidneys.   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

## 2020-08-13 NOTE — Progress Notes (Signed)
Subjective:  Patricia Johnson is a 50 y.o. female who presents for Chief Complaint  Patient presents with   Follow-up    Follow-up on diabetes. BS is 150-160     Here for multiple concerns.    Diabetes - compliant with metformin and glipizide twice daily.  Prior to last visit a month ago, had not been compliant with medicaiton.  Checks sugars daily fating, getting 130-140s.   Denies polydipsia.  No blurred vision.  Needs referral to diabetic eye specialist.   Hyperlipidemia - compliant with atorvastatin daily without side effect. Prior to last visit in June had not been compliant with medication  She notes some puffiness of both ankles in recent weeks.  No other lower leg swelling.  She notes some pains in right great toe.  Feeling like something biting her often in the right toe.  No other foot concerns.  No hx/o gout.  Denies injury or trauma.  Not much exercise.  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    She has history of positive QuantiFERON gold test.  She needs some type of note for work to verify that she has had prior treatment for latent TB.  No other aggravating or relieving factors.    No other c/o.  Past Medical History:  Diagnosis Date   Diabetes mellitus, new onset ([place]) 10/20/2019   Hyperlipidemia associated with type 2 diabetes mellitus ([place]) 10/20/2019   Current Outpatient Medications on File Prior to Visit  Medication Sig Dispense Refill   atorvastatin (LIPITOR) 20 MG tablet Take 1 tablet (20 mg total) by mouth daily. 90 tablet 1   Blood Glucose Monitoring Suppl DEVI Test 1-2 times a day. Dispense one touch meter 1 kit 0   glipiZIDE (GLUCOTROL) 5 MG tablet TAKE 1 TABLET (5 MG TOTAL) BY MOUTH 2 (TWO) TIMES DAILY BEFORE A MEAL. 60 tablet 1   glucose blood test strip Test sugar 1-2 times a day. Pt uses onetouch meter 100 each 1   Lancets (ONETOUCH ULTRASOFT) lancets Test 1-2 times a day 100 each 1   metFORMIN (GLUCOPHAGE) 1000 MG tablet Take 1 tablet (1,000  mg total) by mouth 2 (two) times daily with a meal. 180 tablet 1   No current facility-administered medications on file prior to visit.    The following portions of the patient's history were reviewed and updated as appropriate: allergies, current medications, past family history, past medical history, past social history, past surgical history and problem list.  ROS Otherwise as in subjective above    Objective: BP 130/80   Pulse 90   Wt 221 lb (100.2 kg)   BMI 35.67 kg/m   BP Readings from Last 3 Encounters:  08/13/20 130/80  07/11/20 130/82  10/18/19 130/80   Wt Readings from Last 3 Encounters:  08/13/20 221 lb (100.2 kg)  07/11/20 222 lb 3.2 oz (100.8 kg)  10/18/19 225 lb 12.8 oz (102.4 kg)    General appearance: alert, no distress, well developed, well nourished Neck: supple, no lymphadenopathy, no thyromegaly, no masses Heart: RRR, normal S1, S2, no murmurs Lungs: CTA bilaterally, no wheezes, rhonchi, or rales Pulses: 2+ radial pulses, 2+ pedal pulses, normal cap refill Ext: no edema  Diabetic Foot Exam - Simple   Simple Foot Form Diabetic Foot exam was performed with the following findings: Yes 08/13/2020  8:12 PM  Visual Inspection See comments: Yes Sensation Testing Intact to touch and monofilament testing bilaterally: Yes Pulse Check Posterior Tibialis and Dorsalis pulse intact bilaterally:  Yes Comments Mild puffy edema of bilat ankles.  Tender over right great toe adjacent to lateral proximal nail bed.   Tender over left foot between lateral malleolus and achilles.  No mass, no other lesions.  Slight thickened toenails.   No other foot lesions       Assessment: Encounter Diagnoses  Name Primary?   Great toe pain, right Yes   Left foot pain    Ankle swelling, unspecified laterality    Positive QuantiFERON-TB Gold test    History of latent tuberculosis    Pain around toenail    Uncontrolled type 2 diabetes mellitus with hyperglycemia ([place])     Hyperlipidemia associated with type 2 diabetes mellitus ([place])    Language barrier    Screening for condition      Plan: We discussed her several concerns . We discussed recommendations below.  Spent time on phone with health dept about prior treatment for latent TB in 2019.  Wrote letter on her behalf for her employer.   Obtained records from health dept today verifying prior treatment.     Patient Instructions  Diabetes Based on your home readings that you described today your sugars are looking much better Continue current medications Plan to follow-up in 2 months and we will recheck an hemoglobin A1c at that time  High cholesterol Continue your cholesterol medication and low-cholesterol diet Recommendations for improving lipids:  Foods TO AVOID or limit - fried foods, high sugar foods, white bread, enriched flour, fast food, red meat, large amounts of cheese, processed foods such as little debbie cakes, cookies, pies, donuts, for example  Foods TO INCLUDE in the diet - whole grains such as whole grain pasta, whole grain bread, barley, steel cut oatmeal (not instant oatmeal), avocado, fish, green leafy vegetables, nuts, increased fiber in diet, and using olive oil in small amounts for cooking or as salad dressing vinaigrette.     Right toe pain, rest pain, toenail discomfort I am referring you to a podiatrist/foot doctor for further evaluation They can also do diabetic nail care Consider wearing compression stockings since you are on your feet working all day and have some mild ankle swelling Avoid anti-inflammatories in order to protect your kidneys.  I recommend you avoid medications that can harm the kidneys such as ibuprofen, Aleve, Advil, Motrin, Naprosyn, or prescription anti-inflammatories which are used for pain, inflammation, and arthritis.   You should avoid dehydration which can harm the kidneys.   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  Diagnoses and all orders for this visit:  Great toe pain, right -     Ambulatory referral to Podiatry  Left foot pain -     Ambulatory referral to Podiatry  Ankle swelling, unspecified laterality -     Ambulatory referral to Podiatry  Positive QuantiFERON-TB Gold test  History of latent tuberculosis  Pain around toenail -     Ambulatory referral to Podiatry  Uncontrolled type 2 diabetes mellitus with hyperglycemia ([place])  Hyperlipidemia associated with type 2 diabetes mellitus ([place])  Language barrier  Screening for condition -  Hepatitis B surface antibody,quantitative  Spent > 45 minutes face to face with patient in discussion of symptoms, evaluation, plan and recommendations.     Follow up: pending labs

## 2020-08-13 NOTE — Telephone Encounter (Signed)
Please call health dept to see if they can send [place] verification of prior treatment for latent TB.   She has a document from prior initiation of treatment but no verification.   She needs something to give to her employer  Also, she had a recent +quantiferon gold in May.   Does this need to be repeated, does health dept need to do consult with her?  She is unaware of any recent TB + contacts

## 2020-08-14 LAB — HEPATITIS B SURFACE ANTIBODY, QUANTITATIVE: Hepatitis B Surf Ab Quant: 186.3 m[IU]/mL (ref >9.9)

## 2020-08-19 ENCOUNTER — Other Ambulatory Visit: Payer: Self-pay

## 2020-08-19 ENCOUNTER — Ambulatory Visit
Admission: RE | Admit: 2020-08-19 | Discharge: 2020-08-19 | Disposition: A | Discharge status: Home / Self Care | Payer: 59 | Source: Ambulatory Visit | Attending: Family Medicine | Admitting: Family Medicine

## 2020-08-19 DIAGNOSIS — N644 Mastodynia: Secondary | ICD-10-CM

## 2020-08-22 ENCOUNTER — Ambulatory Visit (INDEPENDENT_AMBULATORY_CARE_PROVIDER_SITE_OTHER): Payer: 59 | Admitting: Podiatry

## 2020-08-22 ENCOUNTER — Other Ambulatory Visit: Payer: Self-pay

## 2020-08-22 ENCOUNTER — Encounter: Payer: Self-pay | Admitting: Podiatry

## 2020-08-22 DIAGNOSIS — M7672 Peroneal tendinitis, left leg: Secondary | ICD-10-CM | POA: Diagnosis not present

## 2020-08-22 DIAGNOSIS — M79609 Pain in unspecified limb: Secondary | ICD-10-CM | POA: Diagnosis not present

## 2020-08-22 DIAGNOSIS — B351 Tinea unguium: Secondary | ICD-10-CM | POA: Diagnosis not present

## 2020-08-22 MED ORDER — LIDOCAINE-PRILOCAINE 2.5-2.5 % EX CREA: 1.0000 "application " | TOPICAL_CREAM | CUTANEOUS | 0 refills | Status: DC | PRN

## 2020-08-22 MED ORDER — CICLOPIROX 8 % EX SOLN: Freq: Every day | CUTANEOUS | 0 refills | Status: DC

## 2020-08-22 NOTE — Patient Instructions (Signed)

## 2020-08-24 ENCOUNTER — Other Ambulatory Visit: Payer: Self-pay | Admitting: Family Medicine

## 2020-08-24 DIAGNOSIS — E1165 Type 2 diabetes mellitus with hyperglycemia: Secondary | ICD-10-CM

## 2020-08-26 ENCOUNTER — Encounter: Payer: Self-pay | Admitting: Podiatry

## 2020-08-26 NOTE — Progress Notes (Signed)
  Subjective:  Patient ID: Patricia Johnson, female    DOB: 03/18/1970,  MRN: [id number]  Chief Complaint  Patient presents with   Foot Pain    )(np) Great toe pain, right;Left foot pain; Ankle swelling, unspecified laterality    50 y.o. female presents with the above complaint. History confirmed with patient.  Most of the pain on the outside of the ankle, she also has pain and discoloration of the left hallux nail  Objective:  Physical Exam: warm, good capillary refill, no trophic changes or ulcerative lesions, normal DP and PT pulses, and normal sensory exam.  Discoloration of hallux nail with mild tenderness.  Pain to palpation of the peroneal tendon course, no gross instability, mild edema here  Assessment:   1. Peroneal tendinitis of left lower extremity   2. Pain due to onychomycosis of nail      Plan:  Patient was evaluated and treated and all questions answered.  Discussed the etiology and treatment options for peroneal tendinitis including stretching, formal physical therapy with an eccentric exercises therapy plan, supportive shoegears such as a running shoe or sneaker, bracing, topical and oral medications.  We also discussed that I do not routinely perform injections in this area because of the risk of an increased damage or rupture of the tendon.  We also discussed the role of surgical treatment of this for patients who do not improve after exhausting non-surgical treatment options.  -XR reviewed with patient -Educated on stretching and icing of the affected limb.   -I think the pain she is having is likely peripheral neuropathy versus possible early nail fungus.  Ciclopirox prescription sent to her pharmacy     No follow-ups on file.

## 2020-09-20 ENCOUNTER — Other Ambulatory Visit: Payer: Self-pay | Admitting: Family Medicine

## 2020-09-20 DIAGNOSIS — E1165 Type 2 diabetes mellitus with hyperglycemia: Secondary | ICD-10-CM

## 2020-09-20 NOTE — Telephone Encounter (Signed)
Has pt in september

## 2020-10-03 ENCOUNTER — Ambulatory Visit (INDEPENDENT_AMBULATORY_CARE_PROVIDER_SITE_OTHER): Payer: 59 | Admitting: Podiatry

## 2020-10-03 ENCOUNTER — Ambulatory Visit (INDEPENDENT_AMBULATORY_CARE_PROVIDER_SITE_OTHER): Payer: 59

## 2020-10-03 ENCOUNTER — Other Ambulatory Visit: Payer: Self-pay

## 2020-10-03 ENCOUNTER — Other Ambulatory Visit: Payer: Self-pay | Admitting: Podiatry

## 2020-10-03 DIAGNOSIS — M7672 Peroneal tendinitis, left leg: Secondary | ICD-10-CM | POA: Diagnosis not present

## 2020-10-03 DIAGNOSIS — M79672 Pain in left foot: Secondary | ICD-10-CM

## 2020-10-03 MED ORDER — CICLOPIROX 8 % EX SOLN: Freq: Every day | CUTANEOUS | 4 refills | Status: DC

## 2020-10-03 NOTE — Patient Instructions (Signed)

## 2020-10-03 NOTE — Telephone Encounter (Signed)
Please advise 

## 2020-10-04 ENCOUNTER — Encounter: Payer: Self-pay | Admitting: Podiatry

## 2020-10-07 NOTE — Progress Notes (Signed)
  Subjective:  Patient ID: Patricia Johnson, female    DOB: [birth year],  MRN: [id number]  Chief Complaint  Patient presents with   Tendonitis    6 week follow up, peroneal tendinitis left    50 y.o. female returns for follow-up with the above complaint. History confirmed with patient.  She continues to have pain on the outside the left ankle  Objective:  Physical Exam: warm, good capillary refill, no trophic changes or ulcerative lesions, normal DP and PT pulses, and normal sensory exam.  Discoloration of hallux nail with mild tenderness.  Pain to palpation of the peroneal tendon course, on the left foot no gross instability, mild edema here  Multiview x-rays of the left ankle taken and showed no gross osseous abnormality in the area of concern Assessment:   1. Peroneal tendinitis, left      Plan:  Patient was evaluated and treated and all questions answered.  Discussed the etiology and treatment options for peroneal tendinitis including stretching, formal physical therapy with an eccentric exercises therapy plan, supportive shoegears such as a running shoe or sneaker, bracing, topical and oral medications.  We also discussed that I do not routinely perform injections in this area because of the risk of an increased damage or rupture of the tendon.  We also discussed the role of surgical treatment of this for patients who do not improve after exhausting non-surgical treatment options.  I recommended she continue her home therapy exercise plans which we printed for her.  Also dispensed an ankle gauntlet for support.  I discussed immobilizing her in a CAM boot or sending her to formal physical therapy and she does not think she be able to do either of these currently.  I will see her back in 2 months for reevaluation.   -Continue ciclopirox for the right hallux nail     Return in about 8 weeks (around 11/28/2020).

## 2020-10-15 ENCOUNTER — Other Ambulatory Visit: Payer: Self-pay | Admitting: Family Medicine

## 2020-10-15 DIAGNOSIS — E1165 Type 2 diabetes mellitus with hyperglycemia: Secondary | ICD-10-CM

## 2020-10-18 ENCOUNTER — Encounter: Payer: 59 | Admitting: Family Medicine

## 2020-10-18 DIAGNOSIS — Z0000 Encounter for general adult medical examination without abnormal findings: Secondary | ICD-10-CM

## 2020-10-23 ENCOUNTER — Encounter: Payer: Self-pay | Admitting: Family Medicine

## 2020-11-10 ENCOUNTER — Other Ambulatory Visit: Payer: Self-pay | Admitting: Family Medicine

## 2020-11-10 DIAGNOSIS — E1165 Type 2 diabetes mellitus with hyperglycemia: Secondary | ICD-10-CM

## 2020-11-25 ENCOUNTER — Other Ambulatory Visit: Payer: Self-pay | Admitting: Family Medicine

## 2020-11-25 DIAGNOSIS — E1165 Type 2 diabetes mellitus with hyperglycemia: Secondary | ICD-10-CM

## 2020-11-28 ENCOUNTER — Ambulatory Visit (INDEPENDENT_AMBULATORY_CARE_PROVIDER_SITE_OTHER): Payer: 59 | Admitting: Podiatry

## 2020-11-28 ENCOUNTER — Other Ambulatory Visit: Payer: Self-pay

## 2020-11-28 DIAGNOSIS — M7672 Peroneal tendinitis, left leg: Secondary | ICD-10-CM | POA: Diagnosis not present

## 2020-11-28 DIAGNOSIS — M79609 Pain in unspecified limb: Secondary | ICD-10-CM

## 2020-11-28 DIAGNOSIS — B351 Tinea unguium: Secondary | ICD-10-CM | POA: Diagnosis not present

## 2020-11-28 MED ORDER — IBUPROFEN 800 MG PO TABS: 800.0000 mg | ORAL_TABLET | Freq: Three times a day (TID) | ORAL | 0 refills | Status: DC | PRN

## 2020-11-28 MED ORDER — LIDOCAINE-PRILOCAINE 2.5-2.5 % EX CREA: 1.0000 "application " | TOPICAL_CREAM | CUTANEOUS | 0 refills | Status: DC | PRN

## 2020-11-28 MED ORDER — CICLOPIROX 8 % EX SOLN: Freq: Every day | CUTANEOUS | 4 refills | Status: AC

## 2020-11-28 NOTE — Patient Instructions (Signed)

## 2020-12-01 ENCOUNTER — Encounter: Payer: Self-pay | Admitting: Podiatry

## 2020-12-01 NOTE — Progress Notes (Signed)
  Subjective:  Patient ID: Patricia Johnson, female    DOB: 04-27-1970,  MRN: [id number]  Chief Complaint  Patient presents with   tendinitis    8 week follow up for Left foot tendonitis. Patient states that her pain is moderate and constant.     50 y.o. female returns for follow-up with the above complaint. History confirmed with patient.  She continues to have pain on the outside the left ankle  Objective:  Physical Exam: warm, good capillary refill, no trophic changes or ulcerative lesions, normal DP and PT pulses, and normal sensory exam.  Discoloration of hallux nail with mild tenderness.  Pain to palpation of the peroneal tendon course, on the left foot no gross instability, mild edema here  Multiview x-rays of the left ankle taken and showed no gross osseous abnormality in the area of concern Assessment:   1. Peroneal tendinitis, left   2. Pain due to onychomycosis of nail       Plan:  Patient was evaluated and treated and all questions answered.  Discussed the etiology and treatment options for peroneal tendinitis including stretching, formal physical therapy with an eccentric exercises therapy plan, supportive shoegears such as a running shoe or sneaker, bracing, topical and oral medications.  We also discussed that I do not routinely perform injections in this area because of the risk of an increased damage or rupture of the tendon.  We also discussed the role of surgical treatment of this for patients who do not improve after exhausting non-surgical treatment options.  I recommended she continue her home therapy exercise plans which we printed for her.  Continue brace. Rx for ibuprofen 800mg  and penlac sent to pharrmacy, as well as lidocaine. Also discussed PT and CAM boot but she is traveling to soon and t his would be very difficult for her    -Continue ciclopirox for the right hallux nail     Return in about 3 months (around 02/28/2021) for  re-check peroneal tendinitis.

## 2020-12-19 ENCOUNTER — Other Ambulatory Visit: Payer: Self-pay

## 2020-12-19 DIAGNOSIS — E1165 Type 2 diabetes mellitus with hyperglycemia: Secondary | ICD-10-CM

## 2020-12-19 MED ORDER — GLIPIZIDE 5 MG PO TABS: ORAL_TABLET | ORAL | 2 refills | Status: AC

## 2020-12-31 ENCOUNTER — Other Ambulatory Visit: Payer: Self-pay | Admitting: Podiatry

## 2021-01-09 ENCOUNTER — Telehealth: Payer: Self-pay | Admitting: Family Medicine

## 2021-01-09 NOTE — Telephone Encounter (Signed)
Dismissal letter in guarantor snapshot  °

## 2021-01-13 ENCOUNTER — Other Ambulatory Visit: Payer: Self-pay

## 2021-01-13 DIAGNOSIS — E1165 Type 2 diabetes mellitus with hyperglycemia: Secondary | ICD-10-CM

## 2021-01-13 MED ORDER — METFORMIN HCL 1000 MG PO TABS: 1000.0000 mg | ORAL_TABLET | Freq: Two times a day (BID) | ORAL | 0 refills | Status: AC

## 2021-03-08 ENCOUNTER — Other Ambulatory Visit: Payer: Self-pay | Admitting: Medical

## 2021-03-08 DIAGNOSIS — E1165 Type 2 diabetes mellitus with hyperglycemia: Secondary | ICD-10-CM

## 2021-03-10 ENCOUNTER — Other Ambulatory Visit: Payer: Self-pay

## 2021-03-10 DIAGNOSIS — E1169 Type 2 diabetes mellitus with other specified complication: Secondary | ICD-10-CM

## 2021-03-10 DIAGNOSIS — E785 Hyperlipidemia, unspecified: Secondary | ICD-10-CM

## 2021-03-10 MED ORDER — ATORVASTATIN CALCIUM 20 MG PO TABS: 20.0000 mg | ORAL_TABLET | Freq: Every day | ORAL | 1 refills | Status: AC

## 2021-03-10 NOTE — Telephone Encounter (Signed)
Unable to reach pt by phone. She may need a letter mailed

## 2021-04-25 ENCOUNTER — Other Ambulatory Visit: Payer: Self-pay | Admitting: Podiatry

## 2021-08-17 ENCOUNTER — Other Ambulatory Visit: Payer: Self-pay | Admitting: Medical

## 2021-08-17 ENCOUNTER — Other Ambulatory Visit: Payer: Self-pay | Admitting: Podiatry

## 2021-08-17 DIAGNOSIS — E1165 Type 2 diabetes mellitus with hyperglycemia: Secondary | ICD-10-CM

## 2021-08-18 NOTE — Telephone Encounter (Signed)
Pt has not been in the office for a year and no pcp please advise [facility]

## 2021-08-22 ENCOUNTER — Ambulatory Visit: Payer: Self-pay | Admitting: Medical

## 2022-04-19 IMAGING — MG DIGITAL DIAGNOSTIC BILAT W/ TOMO W/ CAD
6 of 10 series · 6 of 30 positions shown · non-contrast
Comparison: None.

CLINICAL DATA: Patient reports a lump along the medial aspect of
the right breast.

EXAM:
DIGITAL DIAGNOSTIC BILATERAL MAMMOGRAM WITH TOMOSYNTHESIS AND CAD;
ULTRASOUND RIGHT BREAST LIMITED
TECHNIQUE: Bilateral digital diagnostic mammography and breast tomosynthesis
was performed. The images were evaluated with computer-aided
detection.; Targeted ultrasound examination of the right breast was
performed

[L CC synth-2D]
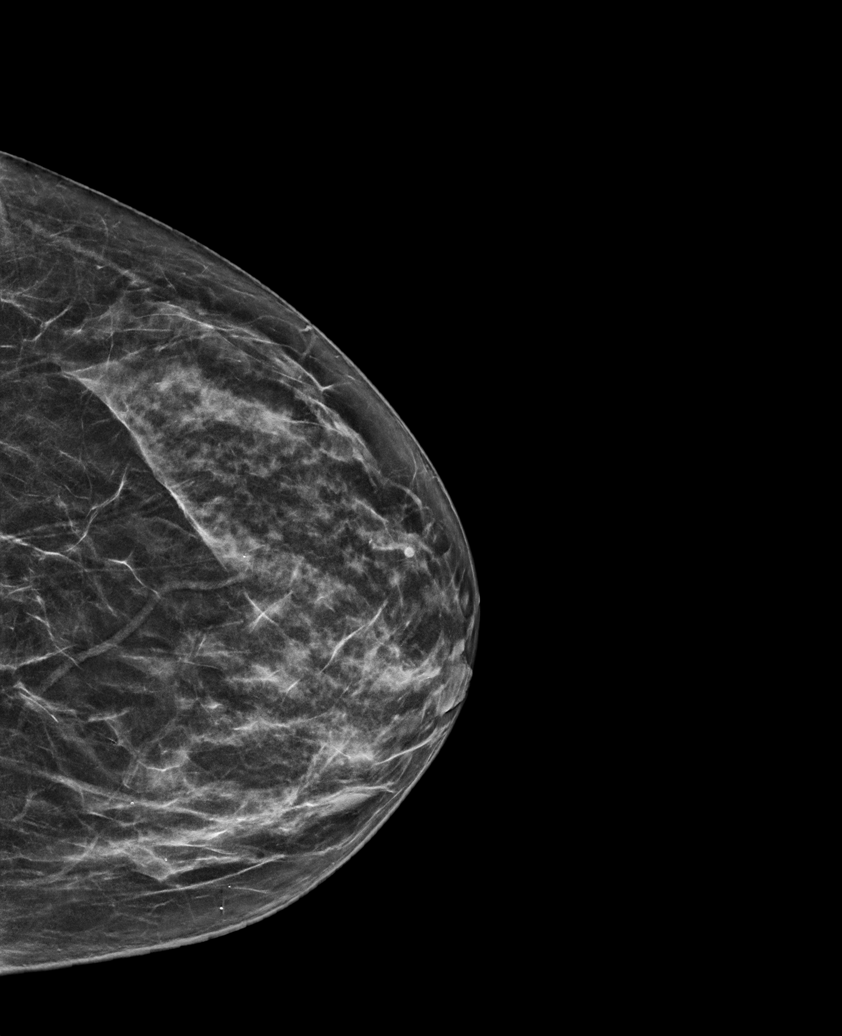

[R CC synth-2D (1 of 2)]
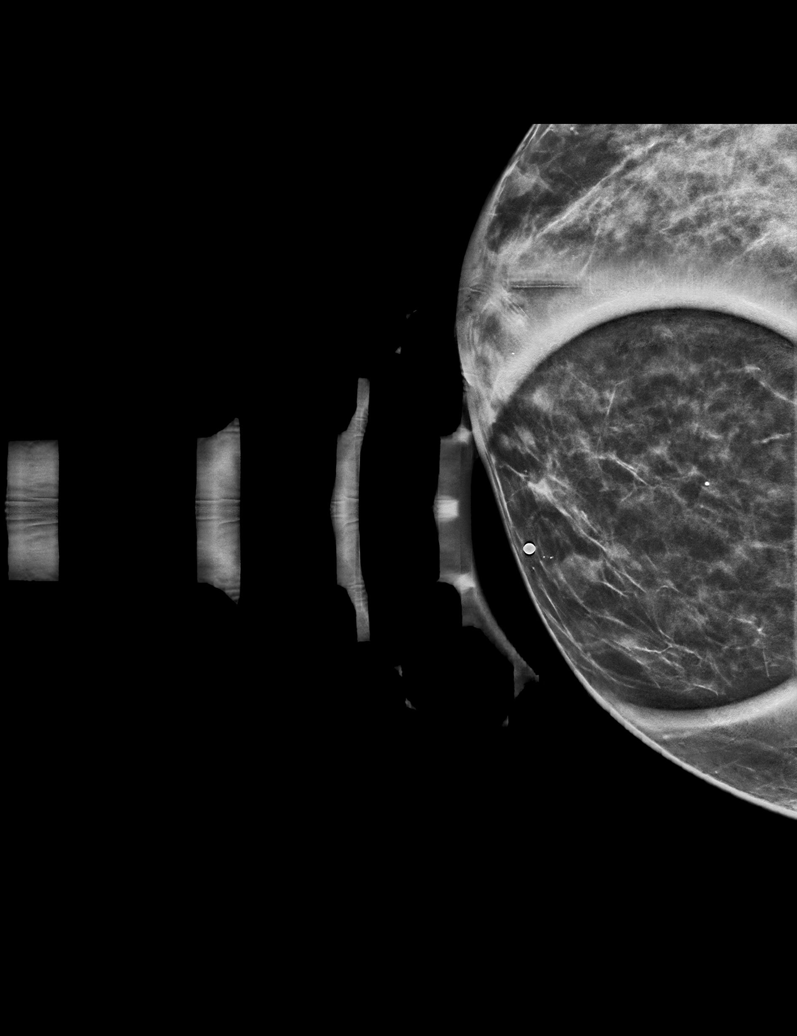

[L MLO synth-2D]
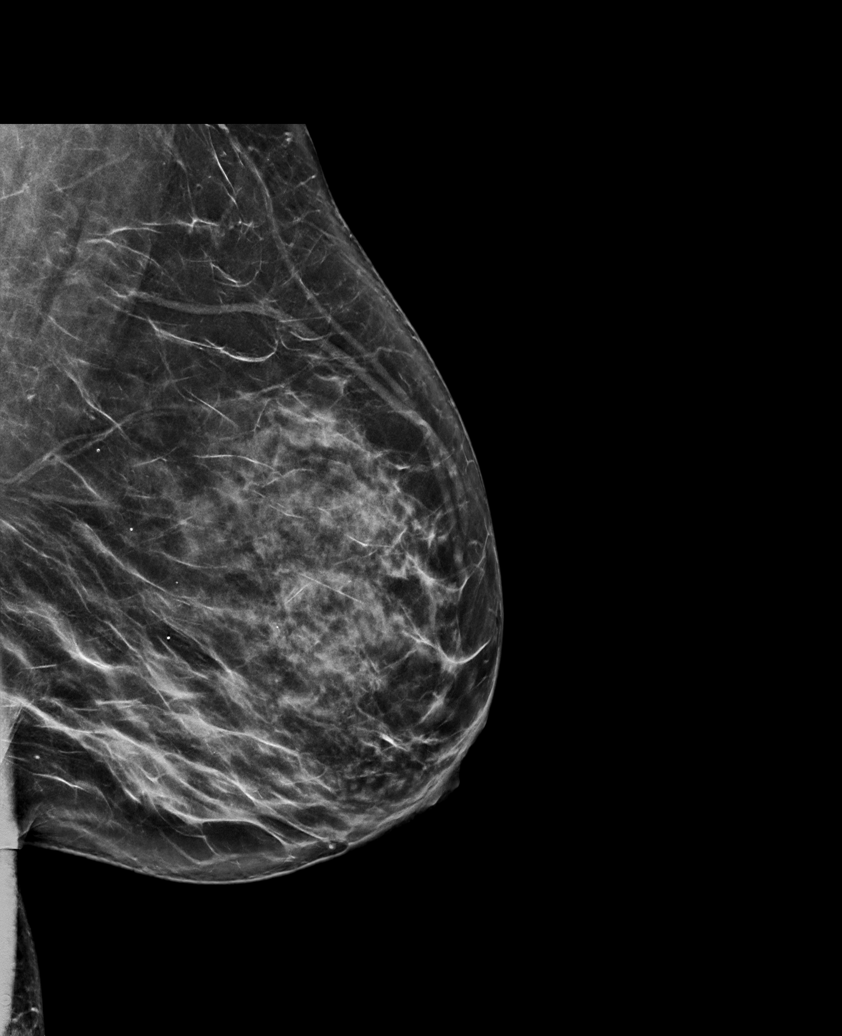

[R MLO synth-2D]
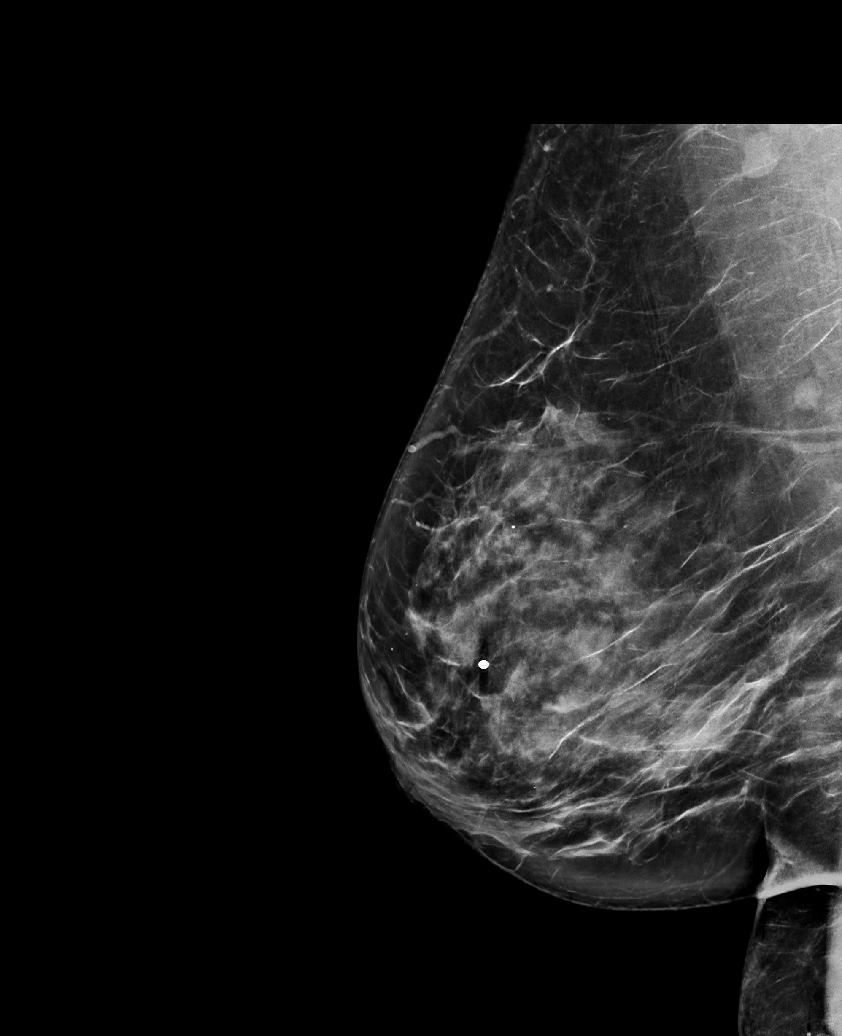

[R CC synth-2D (2 of 2)]
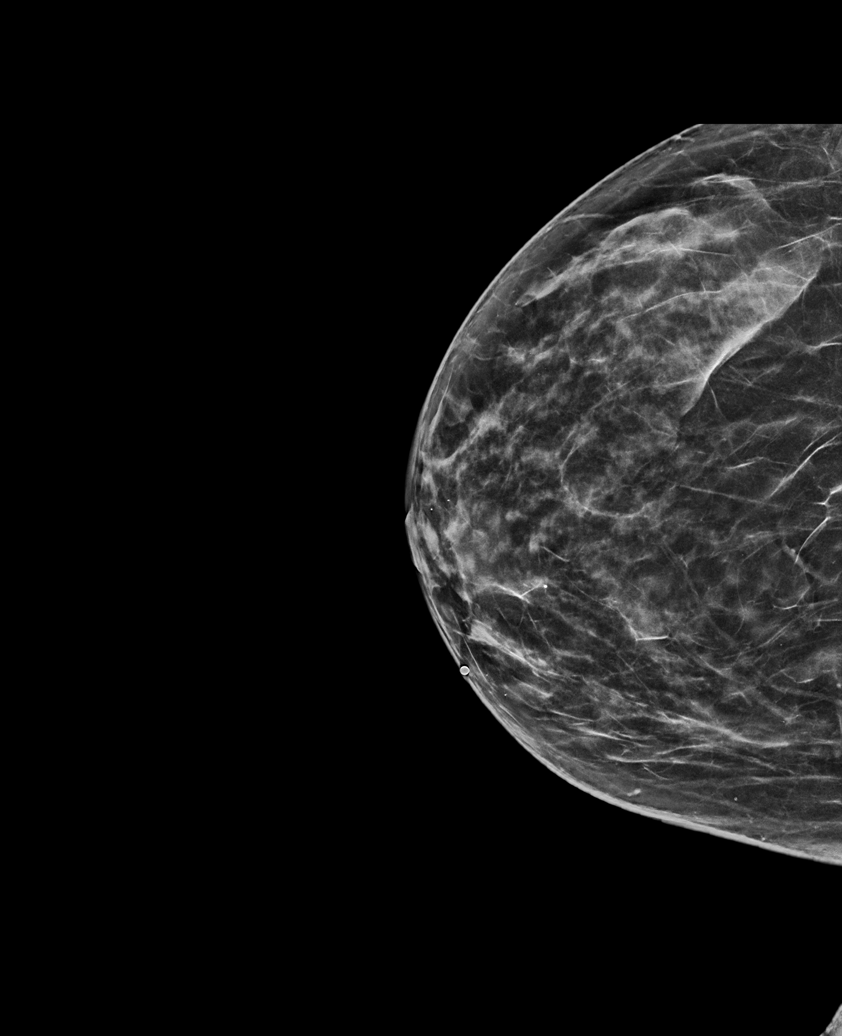

[R CC tomo · tomo slice 36/71.0]
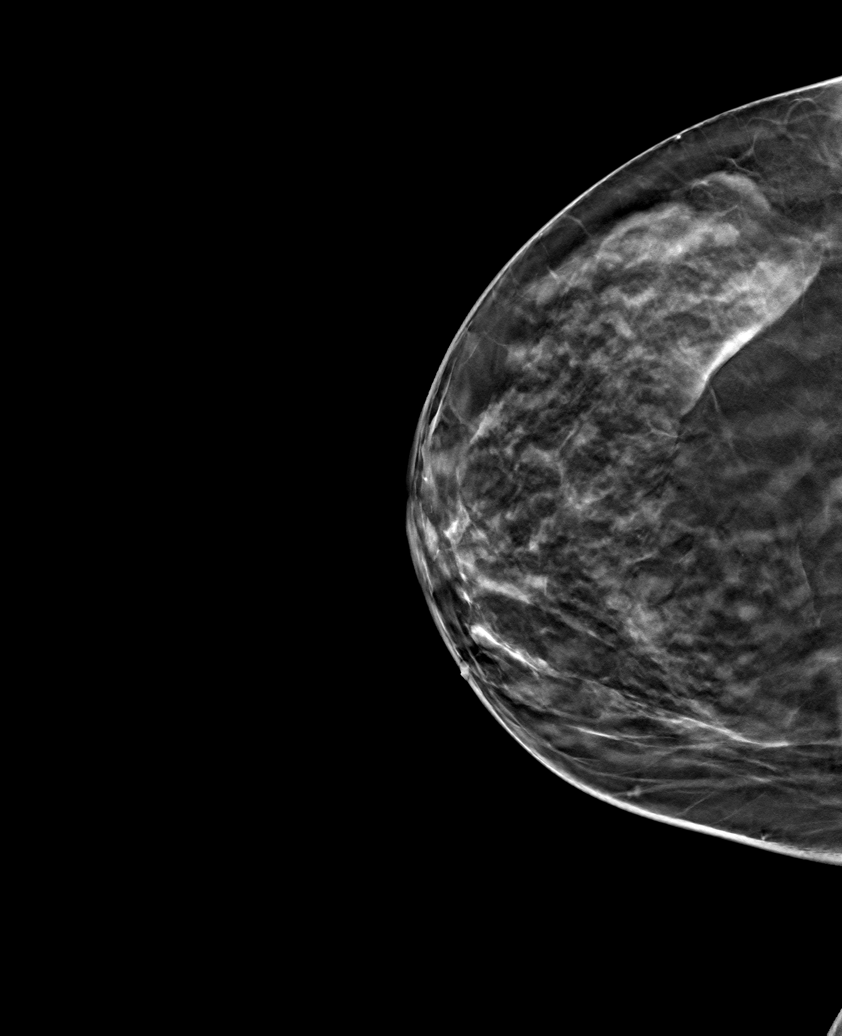

[6 of 30 positions shown; findings below may reference images not displayed]

ACR Breast Density Category c: The breast tissue is heterogeneously
dense, which may obscure small masses.
FINDINGS: There are no masses, areas of architectural distortion, areas of
significant asymmetry or suspicious calcifications.

On physical exam, there is a ridge of prominent tissue medial to the
right nipple. No defined mass.

Targeted ultrasound is performed, showing normal tissue in the
medial right breast, targeting the 2 o'clock position, 5 cm the
nipple where the patient reports the palpable lump. No masses or
suspicious lesions.
IMPRESSION: 1. No evidence of breast malignancy.

RECOMMENDATION:
Screening mammogram in one year.(Code:Y8-S-9FM)

I have discussed the findings and recommendations with the patient.
If applicable, a reminder letter will be sent to the patient
regarding the next appointment.

BI-RADS CATEGORY  1: Negative.

## 2022-05-18 IMAGING — US US BREAST*R* LIMITED INC AXILLA
1 series · 5 of 5 positions shown · non-contrast
Comparison: None.

CLINICAL DATA: Patient reports a lump along the medial aspect of
the right breast.

EXAM:
DIGITAL DIAGNOSTIC BILATERAL MAMMOGRAM WITH TOMOSYNTHESIS AND CAD;
ULTRASOUND RIGHT BREAST LIMITED
TECHNIQUE: Bilateral digital diagnostic mammography and breast tomosynthesis
was performed. The images were evaluated with computer-aided
detection.; Targeted ultrasound examination of the right breast was
performed

[Series 1: us breast*right* limited inc axilla · 0.06mm/px · 5 of 5 slices shown]
[im 1/5]
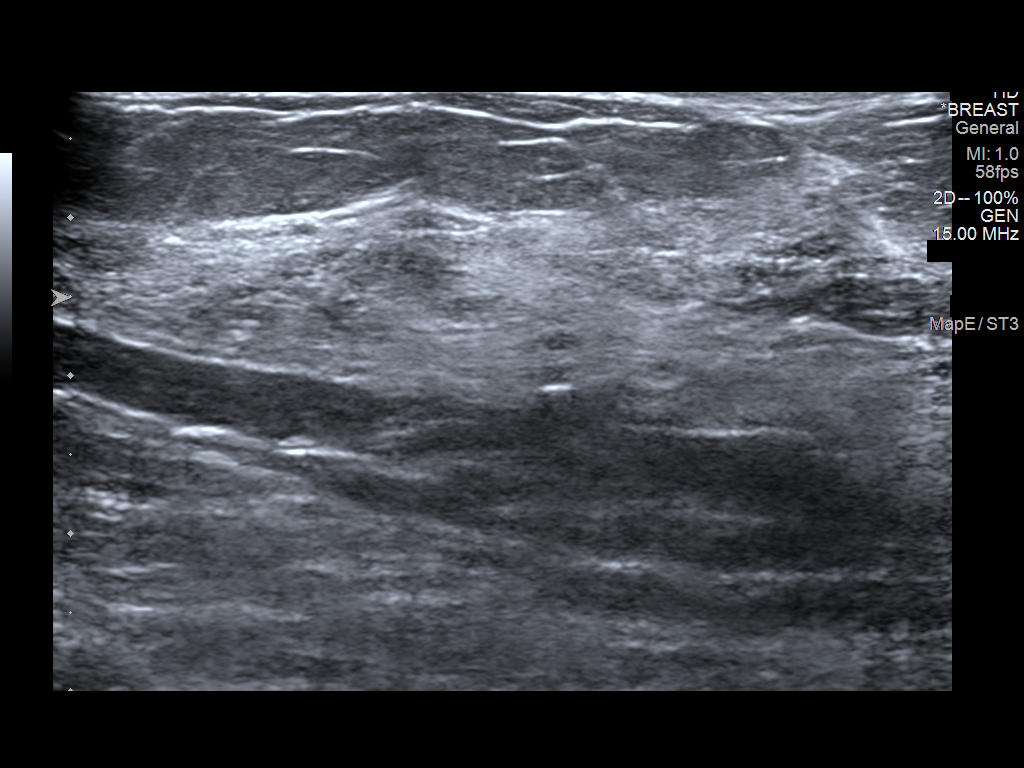
[im 2/5]
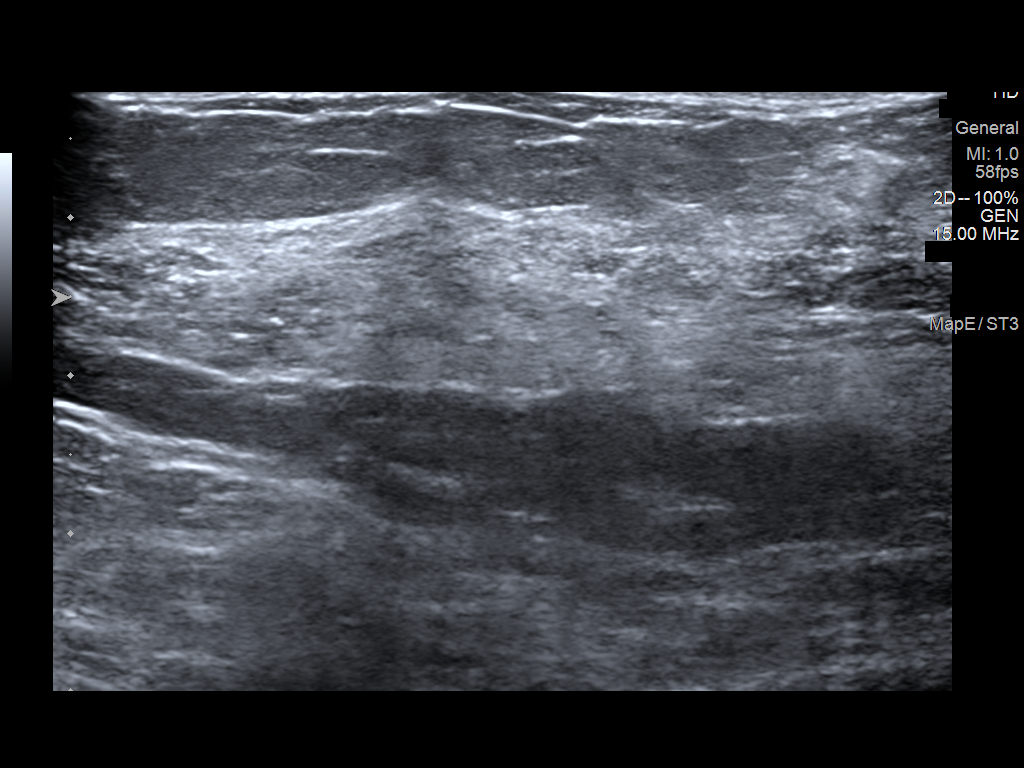
[im 3/5]
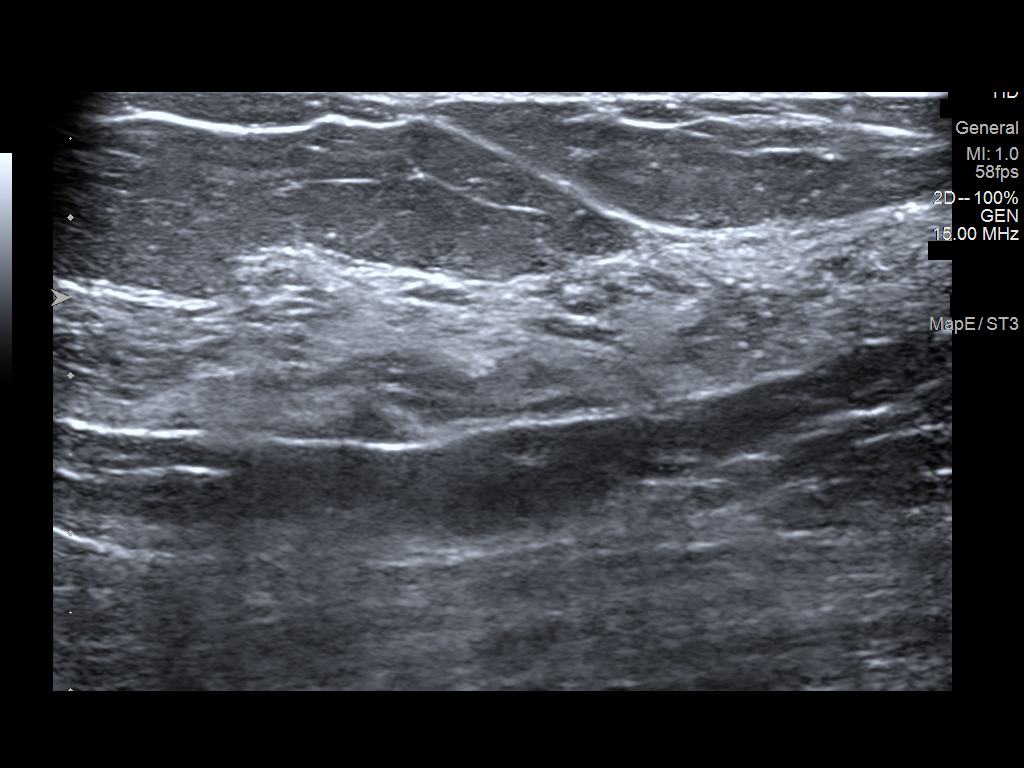
[im 4/5]
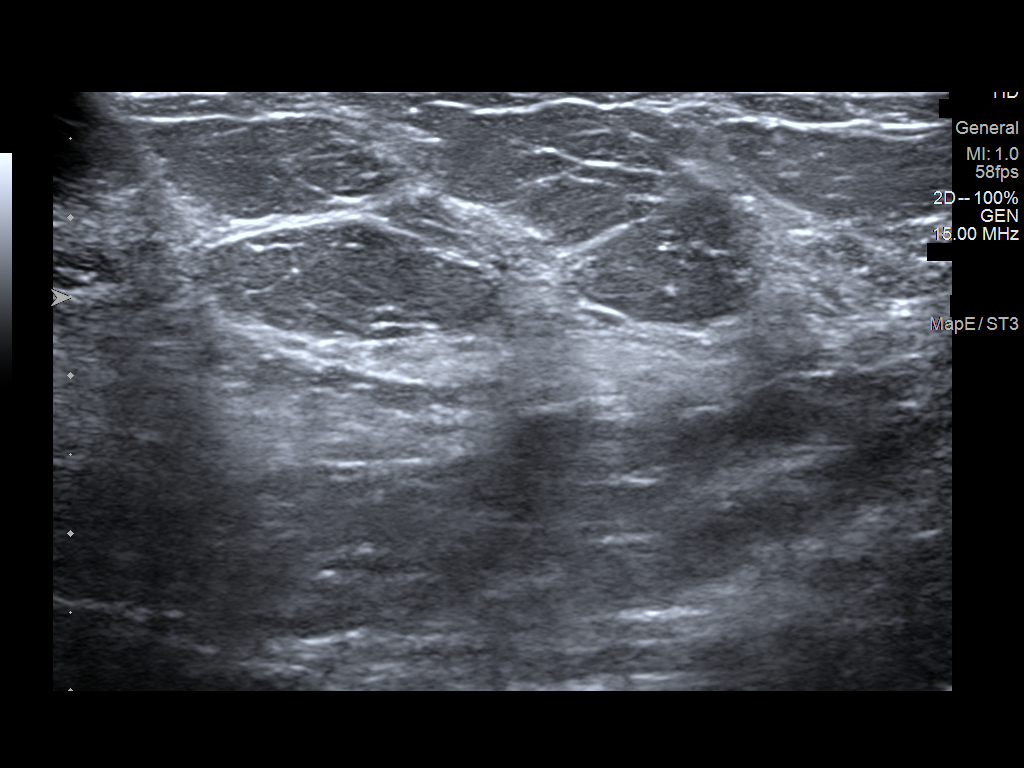
[im 5/5]
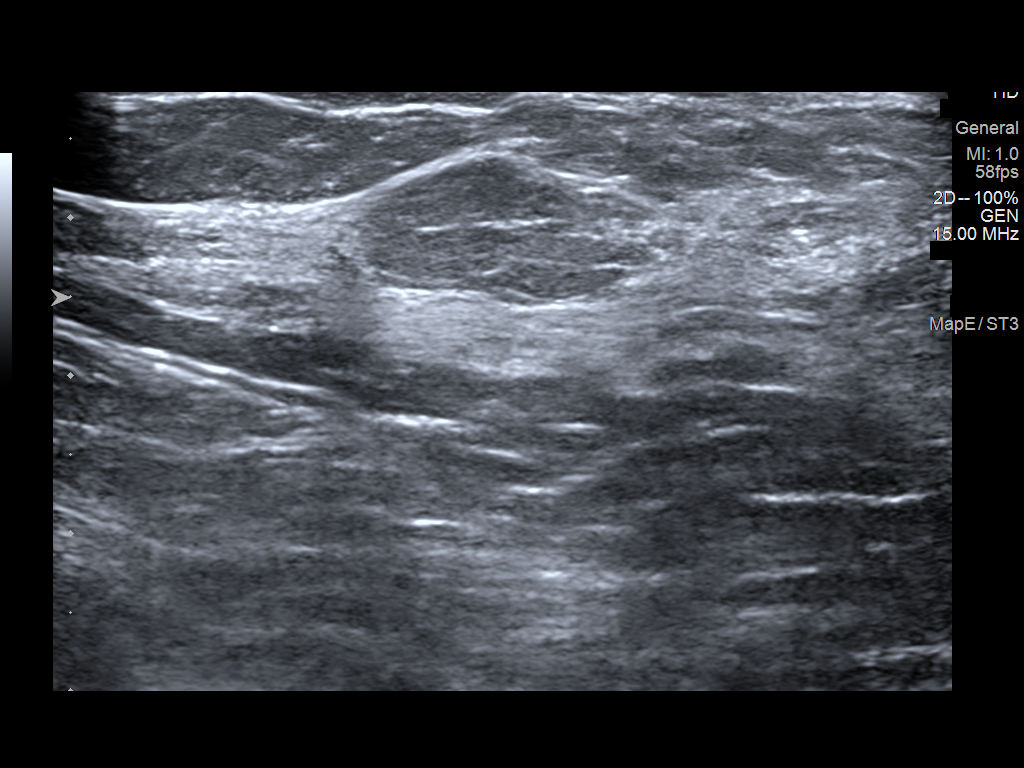

[5 of 5 positions shown; findings below may reference images not displayed]

ACR Breast Density Category c: The breast tissue is heterogeneously
dense, which may obscure small masses.
FINDINGS: There are no masses, areas of architectural distortion, areas of
significant asymmetry or suspicious calcifications.

On physical exam, there is a ridge of prominent tissue medial to the
right nipple. No defined mass.

Targeted ultrasound is performed, showing normal tissue in the
medial right breast, targeting the 2 o'clock position, 5 cm the
nipple where the patient reports the palpable lump. No masses or
suspicious lesions.
IMPRESSION: 1. No evidence of breast malignancy.

RECOMMENDATION:
Screening mammogram in one year.(Code:Y8-S-9FM)

I have discussed the findings and recommendations with the patient.
If applicable, a reminder letter will be sent to the patient
regarding the next appointment.

BI-RADS CATEGORY  1: Negative.

## 2022-11-11 ENCOUNTER — Emergency Department (HOSPITAL_COMMUNITY): Payer: No Typology Code available for payment source

## 2022-11-11 ENCOUNTER — Encounter (HOSPITAL_COMMUNITY): Payer: Self-pay | Admitting: Emergency Medicine

## 2022-11-11 ENCOUNTER — Emergency Department (HOSPITAL_COMMUNITY)
Admission: EM | Admit: 2022-11-11 | Discharge: 2022-11-11 | Disposition: A | Discharge status: Home / Self Care | Payer: No Typology Code available for payment source | Attending: Emergency Medicine | Admitting: Emergency Medicine

## 2022-11-11 ENCOUNTER — Other Ambulatory Visit: Payer: Self-pay

## 2022-11-11 DIAGNOSIS — S0990XA Unspecified injury of head, initial encounter: Secondary | ICD-10-CM

## 2022-11-11 DIAGNOSIS — M25531 Pain in right wrist: Secondary | ICD-10-CM | POA: Insufficient documentation

## 2022-11-11 DIAGNOSIS — M25561 Pain in right knee: Secondary | ICD-10-CM | POA: Insufficient documentation

## 2022-11-11 DIAGNOSIS — M542 Cervicalgia: Secondary | ICD-10-CM | POA: Insufficient documentation

## 2022-11-11 DIAGNOSIS — Y92410 Unspecified street and highway as the place of occurrence of the external cause: Secondary | ICD-10-CM | POA: Diagnosis not present

## 2022-11-11 DIAGNOSIS — S39012A Strain of muscle, fascia and tendon of lower back, initial encounter: Secondary | ICD-10-CM

## 2022-11-11 HISTORY — DX: Essential (primary) hypertension: I10

## 2022-11-11 HISTORY — DX: Type 2 diabetes mellitus without complications: E11.9

## 2022-11-11 MED ORDER — ACETAMINOPHEN 500 MG PO TABS
1000.0000 mg | ORAL_TABLET | Freq: Once | ORAL | Status: AC
  Administered 2022-11-11: 1000 mg via ORAL
  Filled 2022-11-11: qty 2

## 2022-11-11 MED ORDER — HYDROCODONE-ACETAMINOPHEN 5-325 MG PO TABS: 1.0000 | ORAL_TABLET | Freq: Once | ORAL | Status: DC

## 2022-11-11 MED ORDER — HYDROCODONE-ACETAMINOPHEN 5-325 MG PO TABS: 2.0000 | ORAL_TABLET | Freq: Once | ORAL | Status: DC
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
## 2022-11-11 NOTE — ED Notes (Signed)
Pt teaching provided on medications that may cause drowsiness. Pt instructed not to drive or operate heavy machinery while taking the prescribed medication. Pt verbalized understanding.   Pt provided discharge instructions and prescription information. Pt was given the opportunity to ask questions and questions were answered.   
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
## 2022-11-11 NOTE — Discharge Instructions (Signed)
Fortunately your x-rays and CT scans show no broken bones or other emergencies.  For now, apply ice to the sore areas and use ibuprofen and/or Tylenol.  If you develop new or worsening pain, vomiting, shortness of breath, or any other new/concerning symptoms then return to the ER or call 911.

## 2022-11-11 NOTE — ED Notes (Signed)
Pt. Was back board cleared by Dr. Criss Alvine and removed
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
## 2023-07-14 ENCOUNTER — Other Ambulatory Visit (HOSPITAL_BASED_OUTPATIENT_CLINIC_OR_DEPARTMENT_OTHER): Payer: Self-pay

## 2023-07-14 ENCOUNTER — Other Ambulatory Visit (HOSPITAL_COMMUNITY): Payer: Self-pay

## 2023-07-14 MED ORDER — HYDROXYZINE PAMOATE 25 MG PO CAPS
25.0000 mg | ORAL_CAPSULE | Freq: Three times a day (TID) | ORAL | 2 refills | Status: AC | PRN
  Filled 2023-07-14 (×2): qty 30, 10d supply, fill #0

## 2023-07-14 MED ORDER — AMLODIPINE BESYLATE 10 MG PO TABS
10.0000 mg | ORAL_TABLET | Freq: Every day | ORAL | 3 refills | Status: AC
  Filled 2023-07-14 (×2): qty 90, 90d supply, fill #0

## 2023-07-14 MED ORDER — ATORVASTATIN CALCIUM 80 MG PO TABS
80.0000 mg | ORAL_TABLET | Freq: Every day | ORAL | 1 refills | Status: AC
  Filled 2023-07-14 (×2): qty 90, 90d supply, fill #0

## 2023-07-14 MED ORDER — FARXIGA 5 MG PO TABS
5.0000 mg | ORAL_TABLET | Freq: Every day | ORAL | 3 refills | Status: AC
  Filled 2023-07-14 (×2): qty 90, 90d supply, fill #0

## 2023-07-14 MED ORDER — METFORMIN HCL 1000 MG PO TABS
1000.0000 mg | ORAL_TABLET | Freq: Two times a day (BID) | ORAL | 3 refills | Status: AC
  Filled 2023-07-14 (×2): qty 180, 90d supply, fill #0

## 2023-07-14 MED ORDER — MELOXICAM 15 MG PO TABS
15.0000 mg | ORAL_TABLET | Freq: Every day | ORAL | 3 refills | Status: AC
  Filled 2023-07-14 (×2): qty 90, 90d supply, fill #0

## 2023-07-14 MED ORDER — CLOPIDOGREL BISULFATE 75 MG PO TABS
75.0000 mg | ORAL_TABLET | Freq: Every day | ORAL | 9 refills | Status: AC
  Filled 2023-07-14 (×2): qty 30, 30d supply, fill #0

## 2023-07-15 ENCOUNTER — Other Ambulatory Visit: Payer: Self-pay

## 2023-07-15 ENCOUNTER — Other Ambulatory Visit (HOSPITAL_COMMUNITY): Payer: Self-pay

## 2023-07-15 ENCOUNTER — Other Ambulatory Visit (HOSPITAL_BASED_OUTPATIENT_CLINIC_OR_DEPARTMENT_OTHER): Payer: Self-pay

## 2023-07-16 ENCOUNTER — Encounter: Payer: Self-pay | Admitting: Podiatry

## 2023-07-16 ENCOUNTER — Other Ambulatory Visit (HOSPITAL_COMMUNITY): Payer: Self-pay

## 2023-07-16 MED ORDER — PRECISION QID TEST VI STRP
ORAL_STRIP | 3 refills | Status: AC
  Filled 2023-07-16: qty 100, 90d supply, fill #0

## 2023-07-16 MED ORDER — ACCU-CHEK SOFTCLIX LANCETS MISC
Freq: Every day | 3 refills | Status: AC
  Filled 2023-07-16: qty 100, 90d supply, fill #0

## 2023-07-16 MED ORDER — ACCU-CHEK GUIDE W/DEVICE KIT
PACK | Freq: Every day | 0 refills | Status: AC
  Filled 2023-07-16 – 2023-07-27 (×2): qty 1, 30d supply, fill #0

## 2023-07-21 ENCOUNTER — Other Ambulatory Visit (HOSPITAL_COMMUNITY): Payer: Self-pay

## 2023-07-23 ENCOUNTER — Other Ambulatory Visit: Payer: Self-pay

## 2023-07-23 ENCOUNTER — Other Ambulatory Visit (HOSPITAL_COMMUNITY): Payer: Self-pay

## 2023-07-26 ENCOUNTER — Other Ambulatory Visit: Payer: Self-pay

## 2023-07-27 ENCOUNTER — Other Ambulatory Visit (HOSPITAL_COMMUNITY): Payer: Self-pay

## 2023-07-29 ENCOUNTER — Other Ambulatory Visit (HOSPITAL_COMMUNITY): Payer: Self-pay

## 2023-07-31 ENCOUNTER — Other Ambulatory Visit (HOSPITAL_COMMUNITY): Payer: Self-pay

## 2023-08-02 ENCOUNTER — Other Ambulatory Visit: Payer: Self-pay
